# Patient Record
Sex: Male | Born: 1960
Health system: Southern US, Community
[De-identification: ages and names within clinical notes are randomized; demographics above are authoritative.]

## PROBLEM LIST (undated history)

## (undated) DIAGNOSIS — K2270 Barrett's esophagus without dysplasia: Secondary | ICD-10-CM

## (undated) DIAGNOSIS — K259 Gastric ulcer, unspecified as acute or chronic, without hemorrhage or perforation: Secondary | ICD-10-CM

## (undated) DIAGNOSIS — D860 Sarcoidosis of lung: Secondary | ICD-10-CM

## (undated) DIAGNOSIS — K219 Gastro-esophageal reflux disease without esophagitis: Secondary | ICD-10-CM

## (undated) HISTORY — DX: Gastro-esophageal reflux disease without esophagitis: K21.9

## (undated) HISTORY — PX: VASECTOMY: SHX75

## (undated) HISTORY — DX: Sarcoidosis of lung: D86.0

## (undated) HISTORY — DX: Gastric ulcer, unspecified as acute or chronic, without hemorrhage or perforation: K25.9

## (undated) HISTORY — DX: Barrett's esophagus without dysplasia: K22.70

## (undated) HISTORY — PX: VASECTOMY REVERSAL: SHX243

## (undated) HISTORY — PX: LAPAROSCOPIC NISSEN FUNDOPLICATION: SHX1932

---

## 2014-04-07 ENCOUNTER — Ambulatory Visit (INDEPENDENT_AMBULATORY_CARE_PROVIDER_SITE_OTHER): Payer: BC Managed Care – PPO | Admitting: Family Medicine

## 2014-04-07 ENCOUNTER — Other Ambulatory Visit: Payer: Self-pay | Admitting: Physician Assistant

## 2014-04-07 ENCOUNTER — Ambulatory Visit (HOSPITAL_COMMUNITY)
Admission: RE | Admit: 2014-04-07 | Discharge: 2014-04-07 | Disposition: A | Discharge status: Home / Self Care | Payer: BC Managed Care – PPO | Source: Ambulatory Visit | Attending: Physician Assistant | Admitting: Physician Assistant

## 2014-04-07 ENCOUNTER — Telehealth: Payer: Self-pay | Admitting: Physician Assistant

## 2014-04-07 ENCOUNTER — Ambulatory Visit (INDEPENDENT_AMBULATORY_CARE_PROVIDER_SITE_OTHER): Payer: BC Managed Care – PPO

## 2014-04-07 VITALS — BP 193/100 | HR 70 | Temp 97.3°F | Resp 16 | Ht 66.0 in | Wt 183.2 lb

## 2014-04-07 DIAGNOSIS — D869 Sarcoidosis, unspecified: Secondary | ICD-10-CM | POA: Insufficient documentation

## 2014-04-07 DIAGNOSIS — R590 Localized enlarged lymph nodes: Secondary | ICD-10-CM | POA: Insufficient documentation

## 2014-04-07 DIAGNOSIS — R634 Abnormal weight loss: Secondary | ICD-10-CM

## 2014-04-07 DIAGNOSIS — Z1211 Encounter for screening for malignant neoplasm of colon: Secondary | ICD-10-CM

## 2014-04-07 DIAGNOSIS — R072 Precordial pain: Secondary | ICD-10-CM

## 2014-04-07 DIAGNOSIS — K2270 Barrett's esophagus without dysplasia: Secondary | ICD-10-CM | POA: Insufficient documentation

## 2014-04-07 DIAGNOSIS — I10 Essential (primary) hypertension: Secondary | ICD-10-CM | POA: Insufficient documentation

## 2014-04-07 LAB — POCT CBC
Granulocyte percent: 81.4 %G — AB (ref 37–80)
HCT, POC: 48.6 % (ref 43.5–53.7)
HEMOGLOBIN: 16.2 g/dL (ref 14.1–18.1)
LYMPH, POC: 1.1 (ref 0.6–3.4)
MCH, POC: 30.4 pg (ref 27–31.2)
MCHC: 33.4 g/dL (ref 31.8–35.4)
MCV: 91.1 fL (ref 80–97)
MID (cbc): 0.2 (ref 0–0.9)
MPV: 8.2 fL (ref 0–99.8)
POC Granulocyte: 5.6 (ref 2–6.9)
POC LYMPH PERCENT: 15.8 %L (ref 10–50)
POC MID %: 2.8 % (ref 0–12)
Platelet Count, POC: 249 10*3/uL (ref 142–424)
RBC: 5.34 M/uL (ref 4.69–6.13)
RDW, POC: 13.2 %
WBC: 6.9 10*3/uL (ref 4.6–10.2)

## 2014-04-07 MED ORDER — DOXYCYCLINE HYCLATE 100 MG PO CAPS: 100.0000 mg | ORAL_CAPSULE | Freq: Two times a day (BID) | ORAL | Status: DC

## 2014-04-07 MED ORDER — LISINOPRIL 20 MG PO TABS: 20.0000 mg | ORAL_TABLET | Freq: Every day | ORAL | Status: DC

## 2014-04-07 NOTE — Patient Instructions (Addendum)
Go to [facility] register at radiology for U/S today at 2:30 pm   [facility] sent in a referral for you to have an ultrasound of your left armpit to determine what exactly those are. They will be in contact with you to schedule and we will be in contact with you with the results. We've sent in doxycycline 100mg  twice daily for 10 days. This is in case those are abscesses. If they are not we will send you for a lymph node biopsy. Refilled your lisinopril today. Because of your chest pain and a right bundle branch block on your ekg we've referred you to cardiology. They will be in contact with you to schedule.  Please go to ER if chest pain comes on and doesn't go away or if it worsens.  We will be in contact with you regarding the rest of the labs we've ordered.

## 2014-04-07 NOTE — Progress Notes (Signed)
Subjective:    Patient ID: Thomas Jordan, male    DOB: 1961-02-17, 53 y.o.   MRN: [id number]  No PCP Per Patient  Chief Complaint  Patient presents with  . Lymphadenopathy    L axilla x2 days  . Weight Loss    20 lbs over 2 months   There are no active problems to display for this patient.  Prior to Admission medications   Medication Sig Start Date End Date Taking? Authorizing Provider  esomeprazole (NEXIUM) 40 MG capsule Take 40 mg by mouth daily at 12 noon.   Yes Historical Provider, MD  lisinopril (PRINIVIL,ZESTRIL) 20 MG tablet Take 20 mg by mouth daily.   Yes Historical Provider, MD   Medications, allergies, past medical history, surgical history, family history, social history and problem list reviewed and updated.  HPI  59 yom with PMH Barrett's esophagus, sarcoidosis, and HTN presents with left axillary LAN and 20# weight loss past 2 months.   Weight loss has been unintentional. Eating his normal amnt with normal appetite. Two days ago his left armpit was tender and he felt a few swollen LNs. He denies any fever, chills, night sweats, cough, or SOB. He denies recent travel, animal contacts, undercooked meats, unprotected sex or new partners.   He recently moved here from [place] and needs to establish new providers. 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   Denies any family hx of breast cancer or any other cancers.   He mentions he has been having occasional CP over past few months. Sometimes gets the SSCP with exertion but more often at rest. Lasts for minutes and resolves on its own. He is often active without any CP. Rated 2/10 when it comes on. Denies any radiation, SOB, palps, syncope, presyncope, PND.    HTN- Per pt usually well controlled on 20 mg lisinopril. He moved here 4 months ago from [place] and has been out of his meds since then. BP very high today.    Review of  Systems See HPI.     Objective:   Physical Exam  Constitutional: He is oriented to person, place, and time. He appears well-developed and well-nourished.  Non-toxic appearance. He does not have a sickly appearance. He does not appear ill. No distress.  BP 193/100  Pulse 70  Temp(Src) 97.3 F (36.3 C) (Oral)  Resp 16  Ht 5\' 6"  (1.676 m)  Wt 183 lb 3.2 oz (83.099 kg)  BMI 29.58 kg/m2  SpO2 98%   HENT:  Head: Normocephalic and atraumatic.  Right Ear: Hearing, tympanic membrane, external ear and ear canal normal.  Left Ear: Hearing, tympanic membrane, external ear and ear canal normal.  Nose: Nose normal. No mucosal edema or rhinorrhea.  Mouth/Throat: Oropharynx is clear and moist. No oropharyngeal exudate, posterior oropharyngeal edema or posterior oropharyngeal erythema.  Neck: No mass and no thyromegaly present.  Cardiovascular: Normal rate, regular rhythm and normal heart sounds.  Exam reveals no gallop.   No murmur heard. Pulmonary/Chest: Effort normal and breath sounds normal. Not tachypneic. No respiratory distress. He has no decreased breath sounds. He has no wheezes. He has no rhonchi. He has no rales.  Abdominal: Normal appearance and bowel sounds are normal.  Musculoskeletal:       Left shoulder: Normal. He exhibits normal range of motion, no tenderness, no bony tenderness and no swelling.  Lymphadenopathy:  Head (right side): No submental, no submandibular, no tonsillar, no preauricular, no posterior auricular and no occipital adenopathy present.       Head (left side): No submental, no submandibular, no tonsillar, no preauricular, no posterior auricular and no occipital adenopathy present.    He has no cervical adenopathy.    He has axillary adenopathy.       Right axillary: No pectoral and no lateral adenopathy present.       Left axillary: Pectoral and lateral adenopathy present.       Right: No inguinal and no supraclavicular adenopathy present.       Left: No  inguinal and no supraclavicular adenopathy present.  3 swollen LNs/abscesses? centered in left armpit. Each approx 1cm in size. Firm, rubbery. Non-mobile. Pustule centered over 2 of the 3 spots. Otherwise general LAN surrounding left axillary area.   Neurological: He is alert and oriented to person, place, and time.  Skin: No rash noted.  Psychiatric: He has a normal mood and affect. His speech is normal.   Procedure: 1cc 2% lido without epi injected left axilla. Area cleaned and prepped. Attempted pus expression with #11 blade 2 separate swollen areas. No purulence. Area dressed.   Results for orders placed in visit on 04/07/14  POCT CBC      Result Value Ref Range   WBC 6.9  4.6 - 10.2 K/uL   Lymph, poc 1.1  0.6 - 3.4   POC LYMPH PERCENT 15.8  10 - 50 %L   MID (cbc) 0.2  0 - 0.9   POC MID % 2.8  0 - 12 %M   POC Granulocyte 5.6  2 - 6.9   Granulocyte percent 81.4 (*) 37 - 80 %G   RBC 5.34  4.69 - 6.13 M/uL   Hemoglobin 16.2  14.1 - 18.1 g/dL   HCT, POC 48.6  43.5 - 53.7 %   MCV 91.1  80 - 97 fL   MCH, POC 30.4  27 - 31.2 pg   MCHC 33.4  31.8 - 35.4 g/dL   RDW, POC 13.2     Platelet Count, POC 249  142 - 424 K/uL   MPV 8.2  0 - 99.8 fL   [facility] reading (PRIMARY) by  Dr. Lorelei Pont. Findings: No acute abnormality. No hilar LAN.       Assessment & Plan:   70 yom with PMH Barrett's esophagus, sarcoidosis, and HTN presents with left axillary LAN and 20# weight loss past 2 months.    Axillary lymphadenopathy - Plan: DG Chest 2 View, POCT CBC, Comprehensive metabolic panel, CANCELED: CBC with Differential --LAN vs several axillary abscesses --Unable to express pus with #11 blade --Doxy in case abscesses (hx mrsa) --Referral for axillary [place] with f/u pending results  Loss of weight - Plan: DG Chest 2 View, TSH --CXR wnl --CBC wnl --tsh --Await [place] of possible axillary LAN  History Sarcoidosis - Plan: DG Chest 2 View --cxr wnl today  Essential hypertension - Plan: lisinopril  (PRINIVIL,ZESTRIL) 20 MG tablet --BP very elevated today as pt has not been on his lisinopril past 4 months since moving here --Lisinopril refilled  Precordial pain - Plan: EKG 12-Lead --Both exertional and non-exertional --ekg rbbb today. Unsure if new or old --referral to cards - ischemic workup?   Julieta Gutting, PA-C Physician Assistant-Certified [facility]  04/07/2014 12:07 PM

## 2014-04-08 LAB — COMPREHENSIVE METABOLIC PANEL
ALT: 12 U/L (ref 0–53)
AST: 14 U/L (ref 0–37)
Albumin: 4.6 g/dL (ref 3.5–5.2)
Alkaline Phosphatase: 79 U/L (ref 39–117)
BILIRUBIN TOTAL: 0.7 mg/dL (ref 0.2–1.2)
BUN: 12 mg/dL (ref 6–23)
CO2: 29 meq/L (ref 19–32)
Calcium: 9.4 mg/dL (ref 8.4–10.5)
Chloride: 99 mEq/L (ref 96–112)
Creat: 1.03 mg/dL (ref 0.50–1.35)
Glucose, Bld: 83 mg/dL (ref 70–99)
Potassium: 4.8 mEq/L (ref 3.5–5.3)
SODIUM: 139 meq/L (ref 135–145)
TOTAL PROTEIN: 7.2 g/dL (ref 6.0–8.3)

## 2014-04-08 LAB — TSH: TSH: 1.337 u[IU]/mL (ref 0.350–4.500)

## 2014-04-12 NOTE — Telephone Encounter (Signed)
Patient returning call to provider Araceli Bouche  to let him know the lumps have went down and he is feeling better. Patient also wanted to know about a referral he was suppose to have sent to a heart doctor. Patients call back number is [phone]

## 2014-05-10 ENCOUNTER — Ambulatory Visit: Payer: BC Managed Care – PPO | Admitting: Cardiovascular Disease

## 2014-05-15 ENCOUNTER — Encounter: Payer: Self-pay | Admitting: Physician Assistant

## 2016-05-17 IMAGING — US US CHEST/MEDIASTINUM
1 series · 14 of 16 positions shown · non-contrast
Comparison: Chest radiograph 04/07/2014

CLINICAL DATA: Swollen area in the left armpit.

EXAM:
CHEST ULTRASOUND

[Series 1: us chest/mediastinum · 0.05mm/px · 14 of 22 slices shown]
[im 1/22]
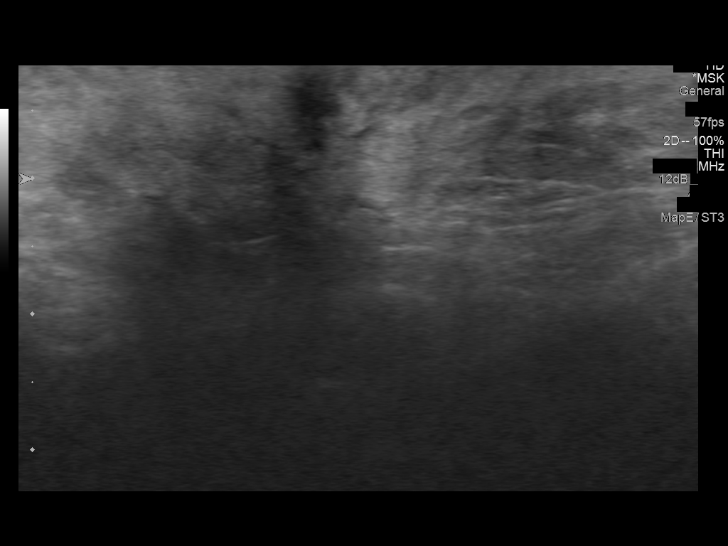
[im 2/22]
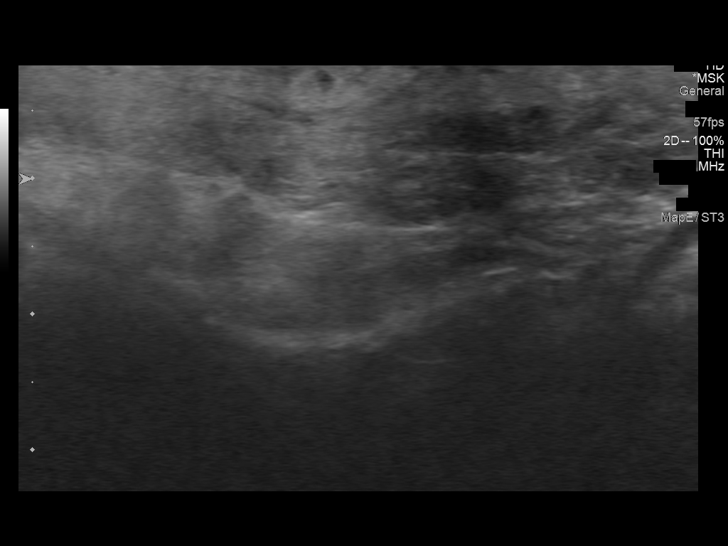
[im 3/22]
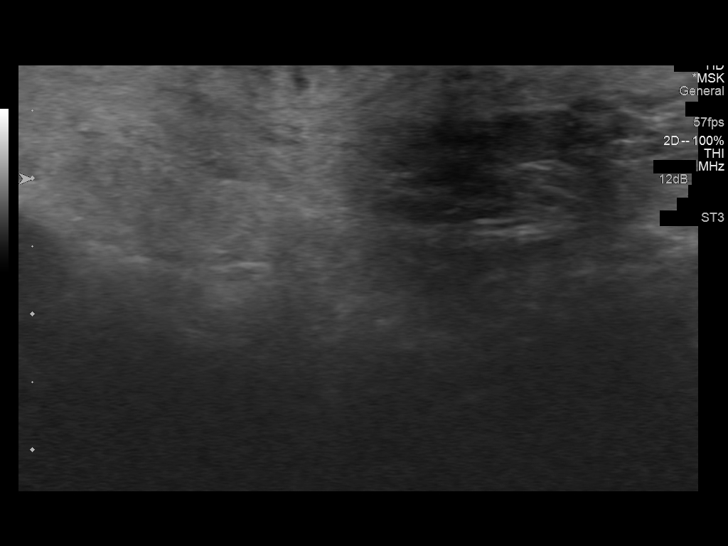
[im 6/22]
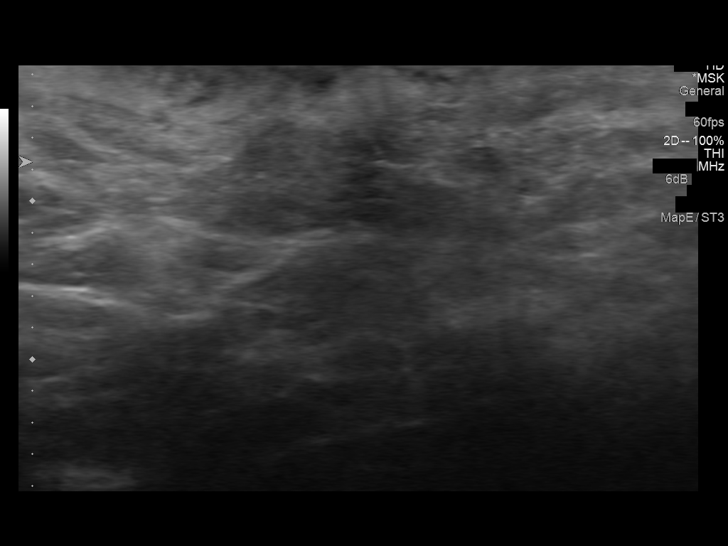
[im 8/22]
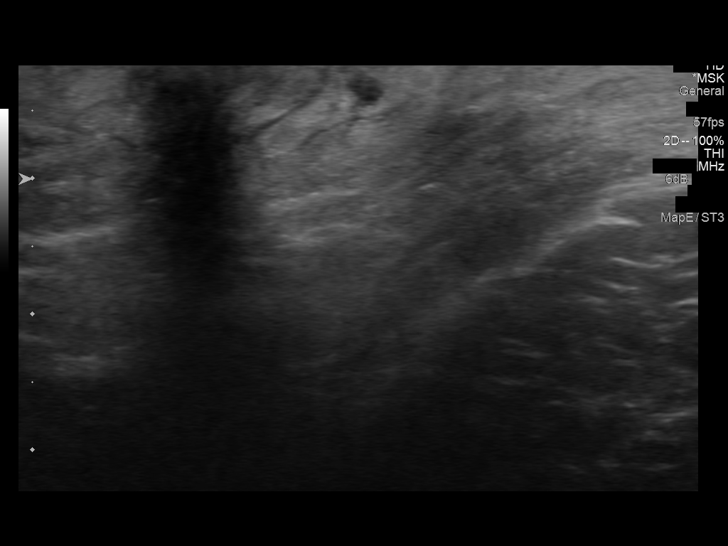
[im 9/22]
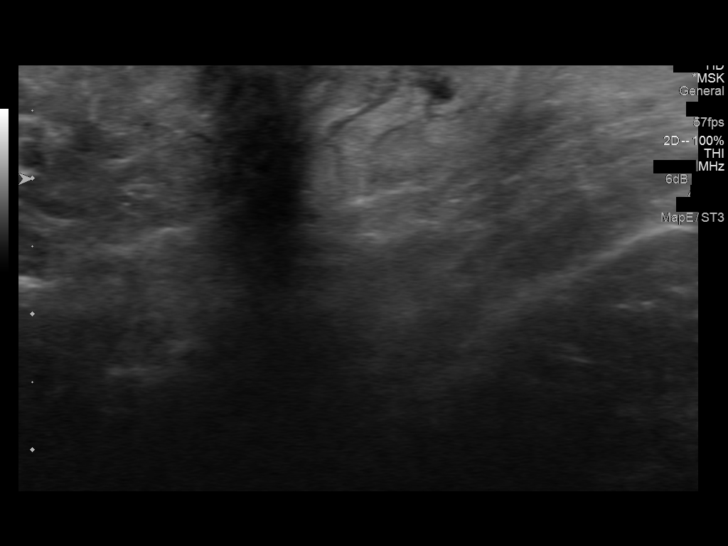
[im 10/22]
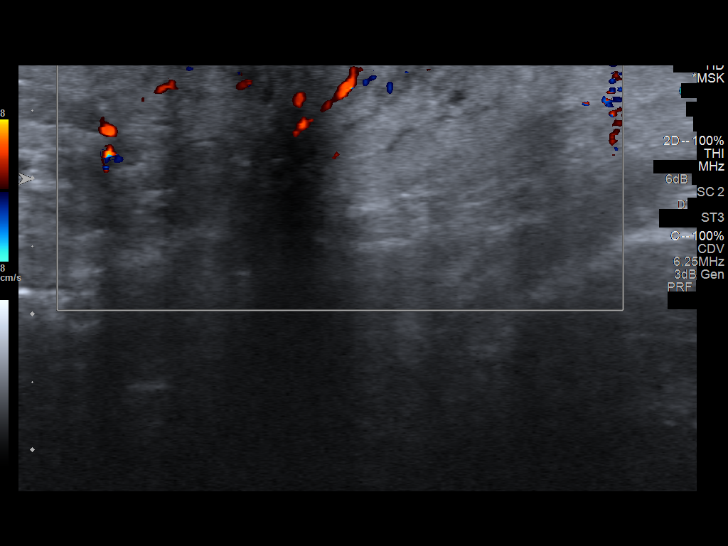
[im 12/22]
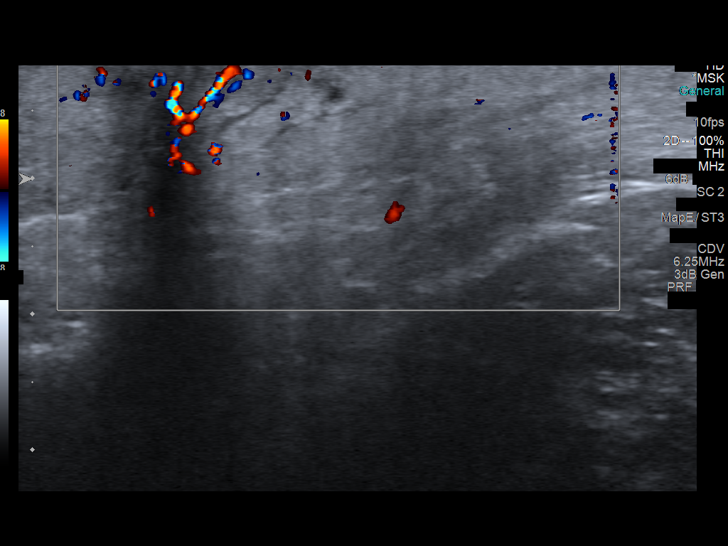
[im 13/22]
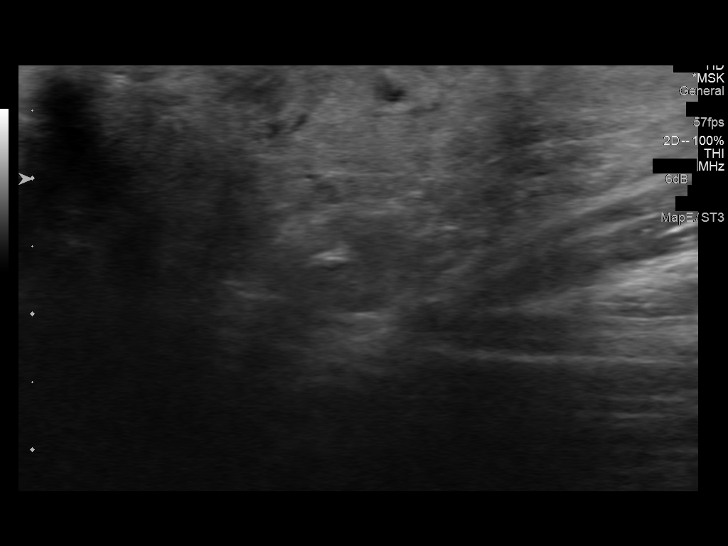
[im 15/22]
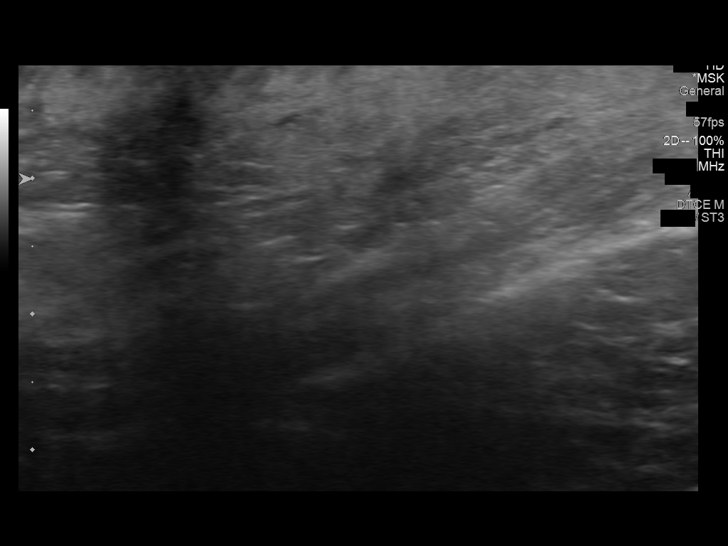
[im 17/22]
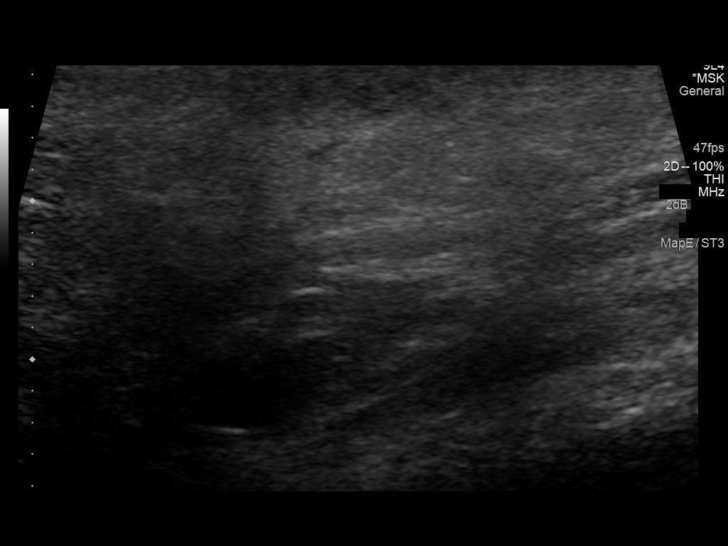
[im 19/22]
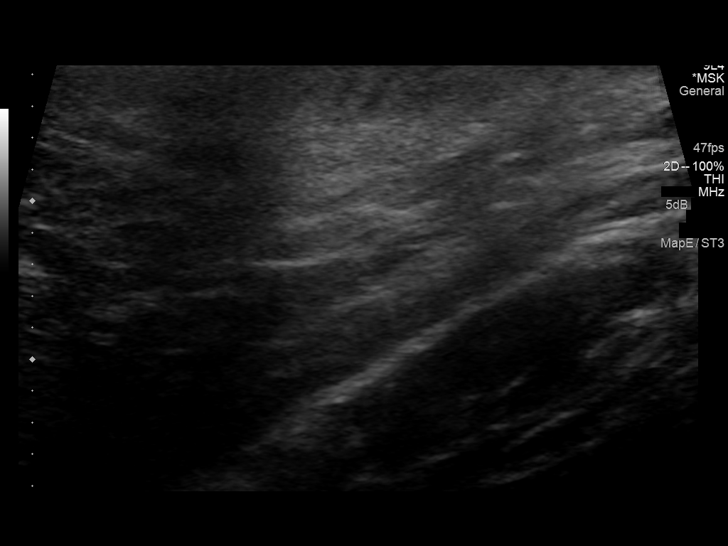
[im 20/22]
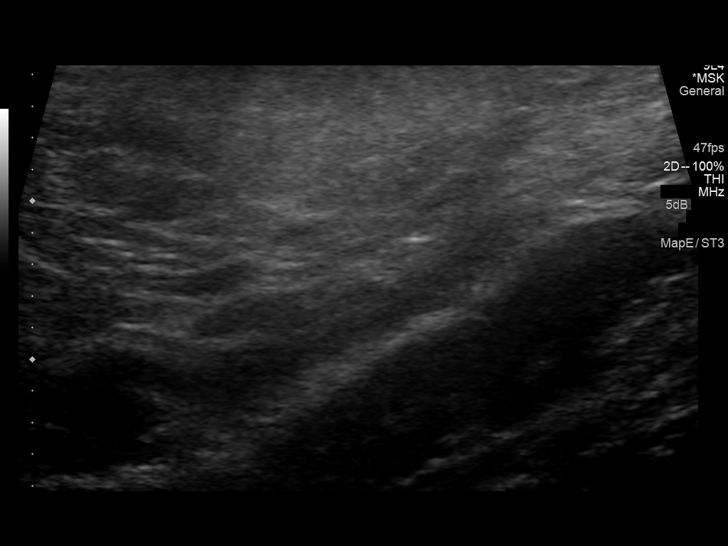
[im 22/22]
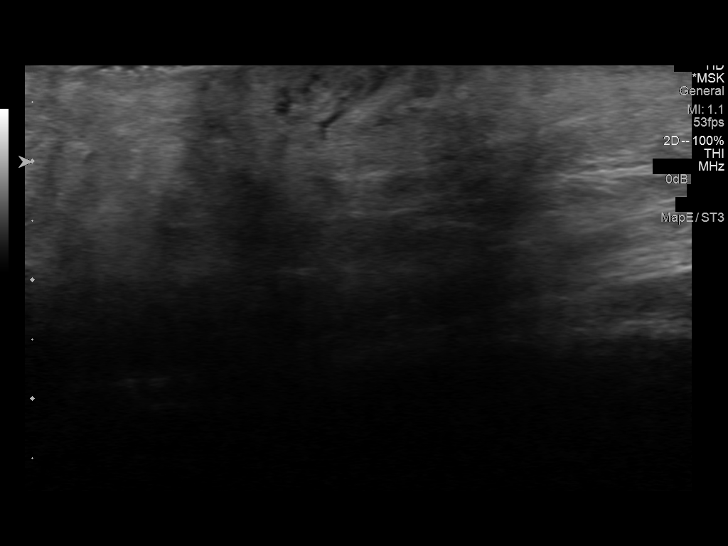

[14 of 16 positions shown; findings below may reference images not displayed]

FINDINGS: Ultrasound images were obtained in the left axilla. There are no
focal solid or cystic lesions in this area. There is mild
subcutaneous edema in the left axilla.
IMPRESSION: Mild subcutaneous edema without focal cystic or solid lesions.

## 2016-05-17 IMAGING — CR DG CHEST 2V
2 series · 2 of 2 positions shown · non-contrast
Comparison: None.

CLINICAL DATA: Weight loss.  History of sarcoidosis

EXAM:
CHEST  2 VIEW

[PA]
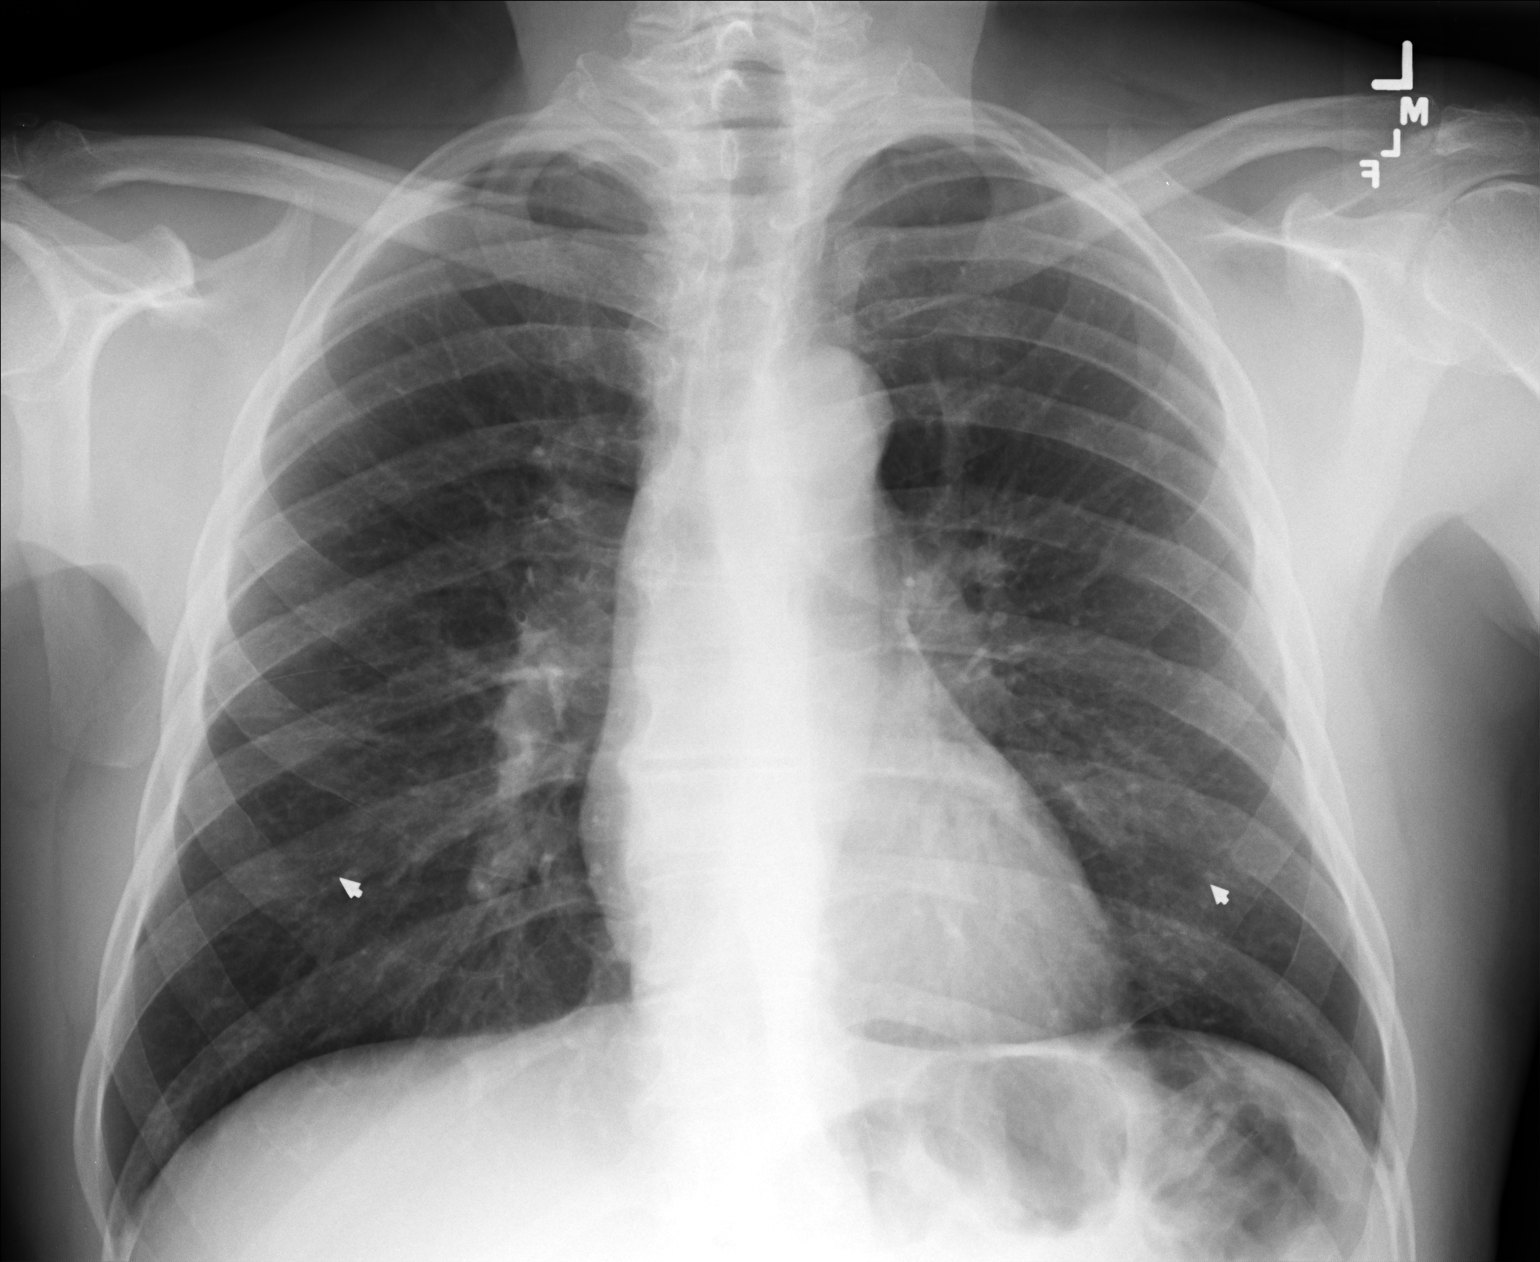

[lateral]
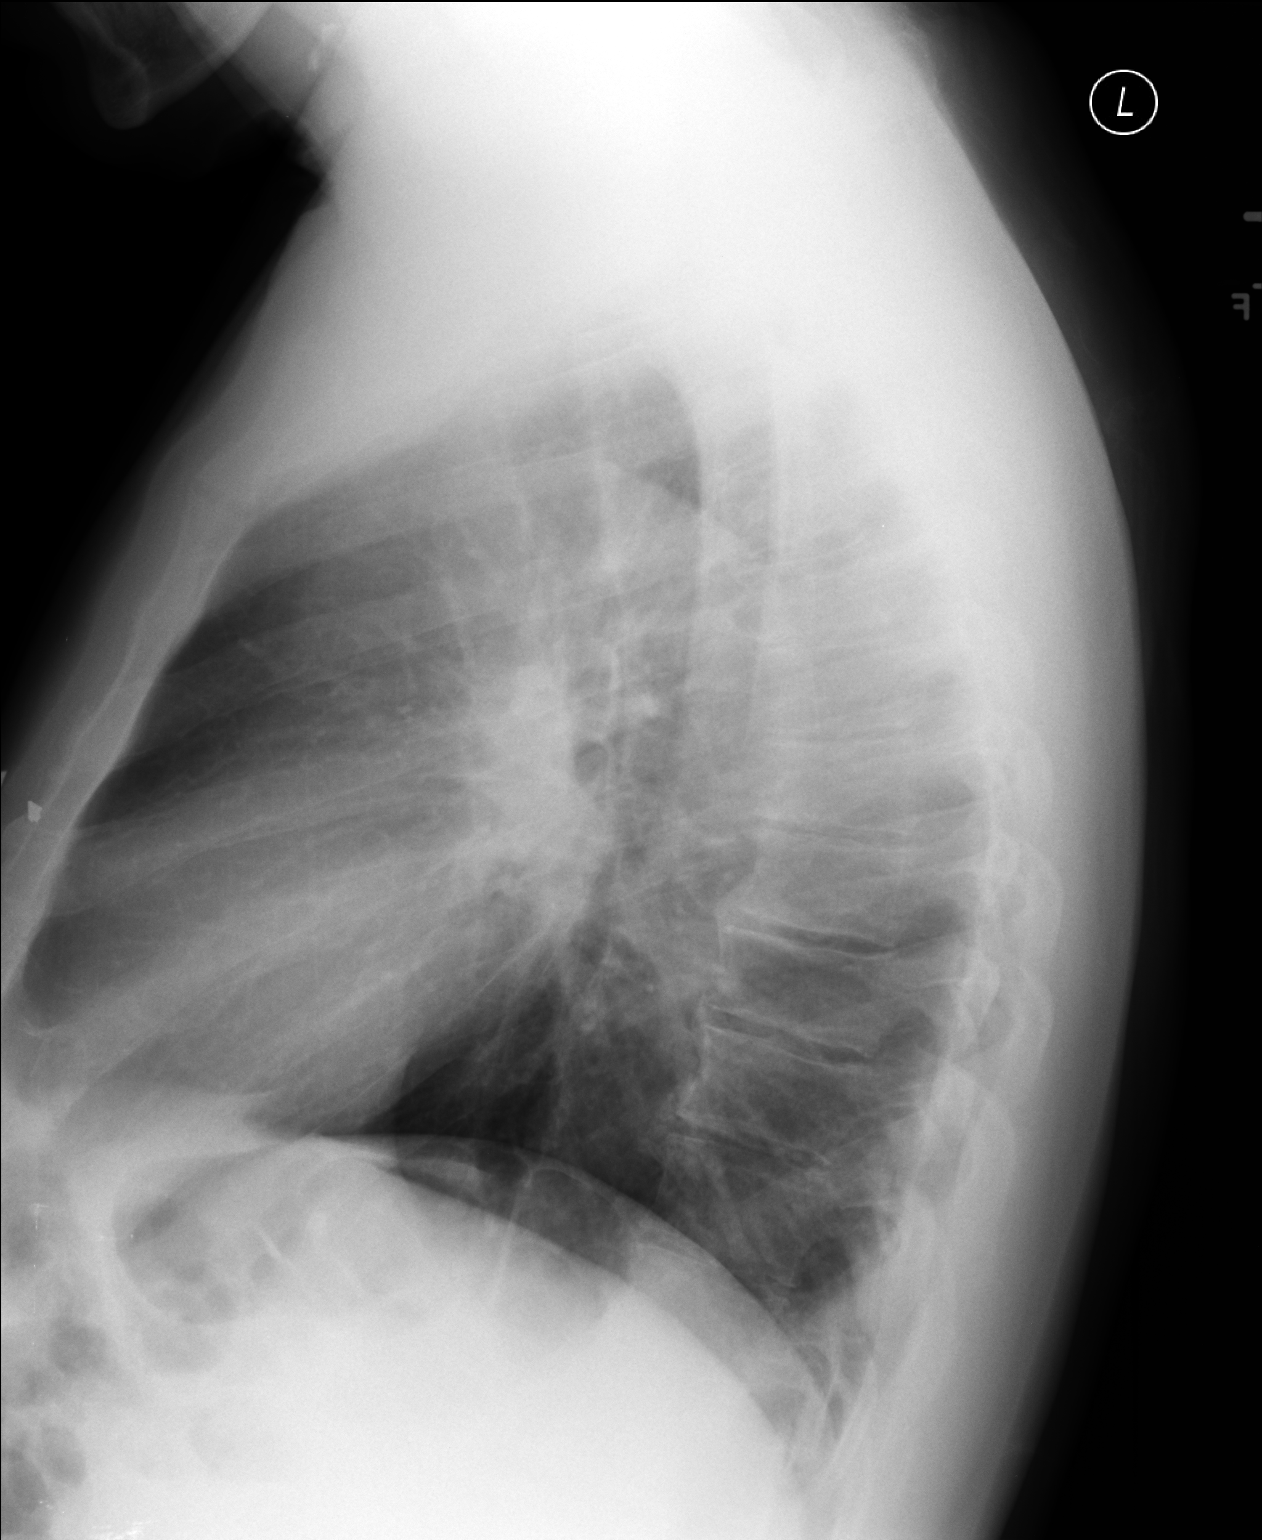

[2 of 2 positions shown; findings below may reference images not displayed]

FINDINGS: Note that nipple markers are present. There is no edema or
consolidation. The interstitium does not appear appreciably
thickened. Heart size and pulmonary vascularity are normal. No
demonstrable adenopathy. There is degenerative change in the
thoracic spine.
IMPRESSION: No edema or consolidation.  No appreciable adenopathy.

## 2019-04-26 DIAGNOSIS — Z23 Encounter for immunization: Secondary | ICD-10-CM | POA: Diagnosis not present

## 2019-08-12 ENCOUNTER — Ambulatory Visit: Payer: Self-pay

## 2020-04-04 DIAGNOSIS — Z23 Encounter for immunization: Secondary | ICD-10-CM | POA: Diagnosis not present

## 2021-01-22 ENCOUNTER — Other Ambulatory Visit: Payer: Self-pay

## 2021-01-22 ENCOUNTER — Ambulatory Visit
Admission: EM | Admit: 2021-01-22 | Discharge: 2021-01-22 | Disposition: A | Discharge status: Home / Self Care | Payer: BC Managed Care – PPO | Attending: Family Medicine | Admitting: Family Medicine

## 2021-01-22 DIAGNOSIS — I10 Essential (primary) hypertension: Secondary | ICD-10-CM | POA: Diagnosis not present

## 2021-01-22 MED ORDER — LISINOPRIL 20 MG PO TABS: 20.0000 mg | ORAL_TABLET | Freq: Every day | ORAL | 0 refills | Status: DC

## 2021-01-22 NOTE — ED Provider Notes (Signed)
[facility]    CSN: [id number] Arrival date & time: 01/22/21  1113      History   Chief Complaint Chief Complaint  Patient presents with   Hypertension    HPI Thomas Jordan is a 60 y.o. male.   HPI Patient presents today for evaluation of hypertension.  Patient underwent a health at work physical today and was subsequently found to have blood pressure readings greater than 200/100.  He was asymptomatic this was a routine health screening.  He reports that he is not having any chest pain, dizziness, headache or shortness of breath.  He was diagnosed with hypertension 7 years ago.  He was started on lisinopril however change jobs and lost health insurance and therefore did not continue with medication.  He was unaware of his blood pressure readings until his health at work physical today.  He would like to resume blood pressure medication and get established with a primary care doctor. Past Medical History:  Diagnosis Date   Barrett esophagus    GERD (gastroesophageal reflux disease)    Improved since Nissen 2007   Sarcoidosis of lung ([place])    Diagnosed on CT but has never had issues with it   Ulcer, stomach peptic    Numerous esophageal ulcers prior to Nissen    Patient Active Problem List   Diagnosis Date Noted   Sarcoidosis 04/07/2014   Barrett's esophagus 04/07/2014   Essential hypertension 04/07/2014    Past Surgical History:  Procedure Laterality Date   LAPAROSCOPIC NISSEN FUNDOPLICATION     VASECTOMY     VASECTOMY REVERSAL         Home Medications    Prior to Admission medications   Medication Sig Start Date End Date Taking? Authorizing Provider  doxycycline (VIBRAMYCIN) 100 MG capsule Take 1 capsule (100 mg total) by mouth 2 (two) times daily. 04/07/14   McVeigh, Todd, PA  esomeprazole (NEXIUM) 40 MG capsule Take 40 mg by mouth daily at 12 noon.    [provider]  lisinopril (ZESTRIL) 20 MG tablet Take 1 tablet (20 mg total) by mouth  daily. Take 1 tablet (20 mg total) by mouth daily. 01/22/21   Scot Jun, FNP    Family History Family History  Problem Relation Age of Onset   Heart disease Mother    Hypertension Mother    Heart disease Father     Social History Social History   Tobacco Use   Smoking status: Never   Smokeless tobacco: Current    Types: Snuff  Substance Use Topics   Alcohol use: Yes    Alcohol/week: 3.0 standard drinks    Types: 3 drink(s) per week   Drug use: No     Allergies   Penicillins   Review of Systems Review of Systems Pertinent negatives listed in HPI  Physical Exam Triage Vital Signs ED Triage Vitals  Enc Vitals Group     BP 01/22/21 1134 (!) 194/93     Pulse Rate 01/22/21 1134 73     Resp 01/22/21 1134 18     Temp 01/22/21 1134 98.5 F (36.9 C)     Temp src --      SpO2 01/22/21 1134 97 %     Weight --      Height --      Head Circumference --      Peak Flow --      Pain Score 01/22/21 1137 0     Pain Loc --  Pain Edu? --      Excl. in [place]? --    No data found.  Updated Vital Signs BP (!) 194/93   Pulse 73   Temp 98.5 F (36.9 C)   Resp 18   SpO2 97%   Visual Acuity Right Eye Distance:   Left Eye Distance:   Bilateral Distance:    Right Eye Near:   Left Eye Near:    Bilateral Near:     Physical Exam Constitutional:      Appearance: Normal appearance.  HENT:     Head: Normocephalic and atraumatic.  Eyes:     General: Lids are normal.     Extraocular Movements: Extraocular movements intact.  Cardiovascular:     Rate and Rhythm: Normal rate and regular rhythm.     Pulses: Normal pulses.     Heart sounds: Murmur heard.  Pulmonary:     Effort: Pulmonary effort is normal.     Breath sounds: Normal breath sounds and air entry. No decreased breath sounds or wheezing.  Musculoskeletal:     Right lower leg: No edema.     Left lower leg: No edema.  Neurological:     Mental Status: He is alert.     UC Treatments / Results   Labs (all labs ordered are listed, but only abnormal results are displayed) Labs Reviewed  BASIC METABOLIC PANEL    EKG   Radiology No results found.  Procedures Procedures (including critical care time)  Medications Ordered in UC Medications - No data to display  Initial Impression / Assessment and Plan / UC Course  I have reviewed the triage vital signs and the nursing notes.  Pertinent labs & imaging results that were available during my care of the patient were reviewed by me and considered in my medical decision making (see chart for details).     Accelerated hypertension with a previous diagnosis of hypertension BMP pending to evaluate electrolyte and renal status ER precaution if any red flag symptoms develop Patient is established with a primary care appointment for September 20 Advised to monitor blood pressure readings at home Final Clinical Impressions(s) / UC Diagnoses   Final diagnoses:  Accelerated hypertension  Essential hypertension     Discharge Instructions      Our office will notify you if your lab warrant any medication changes or any immediate follow-up.   Keep your follow-up with primary care and if unable to keep appointment, call and reschedule.  If able, monitor your blood pressure at home. Goal reading less than 140/90, however since you are restarting blood pressure medication it may take up to 4 weeks for you to see significant reduction in your blood pressure reading.  If at any point you develop any chest pain or chest pressure or severe shortness of breath go immediately to the nearest emergency department for evaluation.     ED Prescriptions     Medication Sig Dispense Auth. Provider   lisinopril (ZESTRIL) 20 MG tablet Take 1 tablet (20 mg total) by mouth daily. Take 1 tablet (20 mg total) by mouth daily. 90 tablet Scot Jun, FNP      PDMP not reviewed this encounter.   Scot Jun, FNP 01/22/21 1310

## 2021-01-22 NOTE — Discharge Instructions (Addendum)
Our office will notify you if your lab warrant any medication changes or any immediate follow-up.   Keep your follow-up with primary care and if unable to keep appointment, call and reschedule.  If able, monitor your blood pressure at home. Goal reading less than 140/90, however since you are restarting blood pressure medication it may take up to 4 weeks for you to see significant reduction in your blood pressure reading.  If at any point you develop any chest pain or chest pressure or severe shortness of breath go immediately to the nearest emergency department for evaluation.

## 2021-01-22 NOTE — ED Triage Notes (Signed)
Pt presents with report of high blood pressure discovered at wellness check was 220/110. Pt states used to take lisnipril but lost weight and quit taking. Pt denies headache or blurred vision

## 2021-01-23 LAB — BASIC METABOLIC PANEL
BUN/Creatinine Ratio: 12 (ref 10–24)
BUN: 11 mg/dL (ref 8–27)
CO2: 21 mmol/L (ref 20–29)
Calcium: 9.8 mg/dL (ref 8.6–10.2)
Chloride: 102 mmol/L (ref 96–106)
Creatinine, Ser: 0.95 mg/dL (ref 0.76–1.27)
Glucose: 79 mg/dL (ref 65–99)
Potassium: 4.2 mmol/L (ref 3.5–5.2)
Sodium: 139 mmol/L (ref 134–144)
eGFR: 91 mL/min/{1.73_m2} (ref >59)

## 2021-02-27 ENCOUNTER — Other Ambulatory Visit: Payer: Self-pay

## 2021-02-27 ENCOUNTER — Encounter: Payer: Self-pay | Admitting: Internal Medicine

## 2021-02-27 ENCOUNTER — Encounter (INDEPENDENT_AMBULATORY_CARE_PROVIDER_SITE_OTHER): Payer: Self-pay | Admitting: *Deleted

## 2021-02-27 ENCOUNTER — Ambulatory Visit: Payer: BC Managed Care – PPO | Admitting: Internal Medicine

## 2021-02-27 VITALS — BP 140/84 | HR 74 | Temp 98.2°F | Resp 18 | Ht 67.0 in | Wt 173.1 lb

## 2021-02-27 DIAGNOSIS — Z7689 Persons encountering health services in other specified circumstances: Secondary | ICD-10-CM

## 2021-02-27 DIAGNOSIS — Z0001 Encounter for general adult medical examination with abnormal findings: Secondary | ICD-10-CM

## 2021-02-27 DIAGNOSIS — Z1159 Encounter for screening for other viral diseases: Secondary | ICD-10-CM

## 2021-02-27 DIAGNOSIS — K22719 Barrett's esophagus with dysplasia, unspecified: Secondary | ICD-10-CM

## 2021-02-27 DIAGNOSIS — D869 Sarcoidosis, unspecified: Secondary | ICD-10-CM

## 2021-02-27 DIAGNOSIS — Z0000 Encounter for general adult medical examination without abnormal findings: Secondary | ICD-10-CM

## 2021-02-27 DIAGNOSIS — Z114 Encounter for screening for human immunodeficiency virus [HIV]: Secondary | ICD-10-CM

## 2021-02-27 DIAGNOSIS — I10 Essential (primary) hypertension: Secondary | ICD-10-CM | POA: Diagnosis not present

## 2021-02-27 DIAGNOSIS — D229 Melanocytic nevi, unspecified: Secondary | ICD-10-CM

## 2021-02-27 MED ORDER — ESOMEPRAZOLE MAGNESIUM 40 MG PO CPDR: 40.0000 mg | DELAYED_RELEASE_CAPSULE | Freq: Every day | ORAL | 1 refills | Status: DC

## 2021-02-27 MED ORDER — LISINOPRIL 20 MG PO TABS: 20.0000 mg | ORAL_TABLET | Freq: Every day | ORAL | 3 refills | Status: DC

## 2021-02-27 NOTE — Progress Notes (Signed)
New Patient Office Visit  Subjective:  Patient ID: Thomas Jordan, male    DOB: December 07, 1960  Age: 60 y.o. MRN: [id number]  CC:  Chief Complaint  Patient presents with   New Patient (Initial Visit)    Establish care bp issues     HPI Perkins Huot is a 60 year old male with PMH of HTN, Barett's esophagus and sarcoidosis who presents for establishing care.  His wife is present during the visit.  HTN: His BP was slightly elevated that target range today. He recently started taking Lisinopril after his BP was found to be ~210/110 at his workplace. He has intermittent, dull headache, but denies dizziness, chest pain, dyspnea or palpitations.  He has had Nissen procedure for dysphagia due to Barrett's esophagus.  He denies any heartburn, dysphagia or odynophagia currently.  He used to take Nexium in the past.  He has history of sarcoidosis, but denies any dyspnea currently.  He had chronic leg swelling and lymphadenopathy, which led to excision of femoral lymph nodes and biopsy.  He was told of sarcoidosis at that time.  His wife has noticed a mole on his back, which is stable in size and color.  Denies any itching or discharge from the mole.  He has had COVID vaccines.    Past Medical History:  Diagnosis Date   Barrett esophagus    GERD (gastroesophageal reflux disease)    Improved since Nissen 2007   Sarcoidosis of lung ([place])    Diagnosed on CT but has never had issues with it   Ulcer, stomach peptic    Numerous esophageal ulcers prior to Nissen    Past Surgical History:  Procedure Laterality Date   LAPAROSCOPIC NISSEN FUNDOPLICATION     VASECTOMY     VASECTOMY REVERSAL      Family History  Problem Relation Age of Onset   Heart disease Mother    Hypertension Mother    Heart disease Father     Social History   Socioeconomic History   Marital status: Married    Spouse name: Not on file   Number of children: Not on file   Years of education: Not on file   Highest  education level: Not on file  Occupational History   Not on file  Tobacco Use   Smoking status: Never   Smokeless tobacco: Current    Types: Snuff  Substance and Sexual Activity   Alcohol use: Yes    Alcohol/week: 3.0 standard drinks    Types: 3 drink(s) per week   Drug use: No   Sexual activity: Not on file  Other Topics Concern   Not on file  Social History Narrative   Not on file   Social Determinants of Health   Financial Resource Strain: Not on file  Food Insecurity: Not on file  Transportation Needs: Not on file  Physical Activity: Not on file  Stress: Not on file  Social Connections: Not on file  Intimate Partner Violence: Not on file    ROS Review of Systems  Constitutional:  Negative for chills and fever.  HENT:  Negative for congestion and sore throat.   Eyes:  Negative for pain and discharge.  Respiratory:  Negative for cough and shortness of breath.   Cardiovascular:  Negative for chest pain and palpitations.  Gastrointestinal:  Negative for constipation, diarrhea, nausea and vomiting.  Endocrine: Negative for polydipsia and polyuria.  Genitourinary:  Negative for dysuria and hematuria.  Musculoskeletal:  Negative for neck pain and neck stiffness.  Skin:  Negative for rash.  Neurological:  Positive for headaches. Negative for dizziness, weakness and numbness.  Psychiatric/Behavioral:  Negative for agitation and behavioral problems.    Objective:   Today's Vitals: BP 140/84 (BP Location: Left Arm, Cuff Size: Normal)   Pulse 74   Temp 98.2 F (36.8 C) (Oral)   Resp 18   Ht 5\' 7"  (1.702 m)   Wt 173 lb 1.3 oz (78.5 kg)   SpO2 96%   BMI 27.11 kg/m   Physical Exam Vitals reviewed.  Constitutional:      General: He is not in acute distress.    Appearance: He is not diaphoretic.  HENT:     Head: Normocephalic and atraumatic.     Nose: Nose normal.     Mouth/Throat:     Mouth: Mucous membranes are moist.  Eyes:     General: No scleral icterus.     Extraocular Movements: Extraocular movements intact.  Cardiovascular:     Rate and Rhythm: Normal rate and regular rhythm.     Pulses: Normal pulses.     Heart sounds: Normal heart sounds. No murmur heard. Pulmonary:     Breath sounds: Normal breath sounds. No wheezing or rales.  Musculoskeletal:     Cervical back: Neck supple. No tenderness.     Right lower leg: No edema.     Left lower leg: No edema.  Skin:    General: Skin is warm.     Findings: Rash (Brownish papule - about 1 cm in diameter over left side of back (flank area)) present.  Neurological:     General: No focal deficit present.     Mental Status: He is alert and oriented to person, place, and time.  Psychiatric:        Mood and Affect: Mood normal.        Behavior: Behavior normal.    Assessment & Plan:   Problem List Items Addressed This Visit       Cardiovascular and Mediastinum   Essential hypertension    BP Readings from Last 1 Encounters:  02/27/21 140/84  Elevated with Lisinopril 20 mg QD, but he recently started taking it, will continue for now Counseled for compliance with the medications Advised DASH diet and moderate exercise/walking, at least 150 mins/week       Relevant Medications   lisinopril (ZESTRIL) 20 MG tablet     Digestive   Barrett's esophagus    S/p Nissen procedure in 2007 Started Nexium Referred to GI      Relevant Medications   esomeprazole (NEXIUM) 40 MG capsule   Other Relevant Orders   Ambulatory referral to Gastroenterology     Musculoskeletal and Integument   Benign pigmented mole    Over back region about 1 cm in diameter No asymmetry or color variation, no change in size recently, no discharge Monitor for now        Other   Sarcoidosis    Asymptomatic currently      Encounter to establish care - Primary    Care established Previous chart reviewed History and medications reviewed with the patient      Other Visit Diagnoses        Need for  hepatitis C screening test       Relevant Orders   Hepatitis C Antibody   Encounter for screening for HIV       Relevant Orders   HIV antibody (with reflex)       Outpatient Encounter Medications  as of 02/27/2021  Medication Sig   esomeprazole (NEXIUM) 40 MG capsule Take 1 capsule (40 mg total) by mouth daily.   [DISCONTINUED] lisinopril (ZESTRIL) 20 MG tablet Take 1 tablet (20 mg total) by mouth daily. Take 1 tablet (20 mg total) by mouth daily.   lisinopril (ZESTRIL) 20 MG tablet Take 1 tablet (20 mg total) by mouth daily. Take 1 tablet (20 mg total) by mouth daily.   [DISCONTINUED] doxycycline (VIBRAMYCIN) 100 MG capsule Take 1 capsule (100 mg total) by mouth 2 (two) times daily.   [DISCONTINUED] esomeprazole (NEXIUM) 40 MG capsule Take 40 mg by mouth daily at 12 noon.   No facility-administered encounter medications on file as of 02/27/2021.    Follow-up: Return in about 4 months (around 06/29/2021) for Annual physical.   Lindell Spar, MD

## 2021-02-27 NOTE — Assessment & Plan Note (Signed)
Care established Previous chart reviewed History and medications reviewed with the patient 

## 2021-02-27 NOTE — Assessment & Plan Note (Signed)
BP Readings from Last 1 Encounters:  02/27/21 140/84   Elevated with Lisinopril 20 mg QD, but he recently started taking it, will continue for now Counseled for compliance with the medications Advised DASH diet and moderate exercise/walking, at least 150 mins/week

## 2021-02-27 NOTE — Patient Instructions (Signed)
Please continue taking Lisinopril as prescribed.  Please follow DASH diet and perform moderate exercise/walking at least 150 mins/week.  Please start taking Nexium.  Please get fasting blood tests done within a week.

## 2021-02-27 NOTE — Assessment & Plan Note (Signed)
S/p Nissen procedure in 2007 Started Nexium Referred to GI

## 2021-02-27 NOTE — Assessment & Plan Note (Signed)
Over back region about 1 cm in diameter No asymmetry or color variation, no change in size recently, no discharge Monitor for now

## 2021-02-27 NOTE — Assessment & Plan Note (Signed)
Asymptomatic currently 

## 2021-03-01 DIAGNOSIS — Z1159 Encounter for screening for other viral diseases: Secondary | ICD-10-CM | POA: Diagnosis not present

## 2021-03-01 DIAGNOSIS — Z0000 Encounter for general adult medical examination without abnormal findings: Secondary | ICD-10-CM | POA: Diagnosis not present

## 2021-03-01 DIAGNOSIS — Z114 Encounter for screening for human immunodeficiency virus [HIV]: Secondary | ICD-10-CM | POA: Diagnosis not present

## 2021-03-02 LAB — CBC WITH DIFFERENTIAL/PLATELET
Basophils Absolute: 0.1 10*3/uL (ref 0.0–0.2)
Basos: 1 %
EOS (ABSOLUTE): 0.5 10*3/uL — ABNORMAL HIGH (ref 0.0–0.4)
Eos: 9 %
Hematocrit: 42.7 % (ref 37.5–51.0)
Hemoglobin: 14.2 g/dL (ref 13.0–17.7)
Immature Grans (Abs): 0 10*3/uL (ref 0.0–0.1)
Immature Granulocytes: 0 %
Lymphocytes Absolute: 1 10*3/uL (ref 0.7–3.1)
Lymphs: 19 %
MCH: 30.9 pg (ref 26.6–33.0)
MCHC: 33.3 g/dL (ref 31.5–35.7)
MCV: 93 fL (ref 79–97)
Monocytes Absolute: 0.5 10*3/uL (ref 0.1–0.9)
Monocytes: 10 %
Neutrophils Absolute: 3.2 10*3/uL (ref 1.4–7.0)
Neutrophils: 61 %
Platelets: 224 10*3/uL (ref 150–450)
RBC: 4.6 x10E6/uL (ref 4.14–5.80)
RDW: 13.5 % (ref 11.6–15.4)
WBC: 5.3 10*3/uL (ref 3.4–10.8)

## 2021-03-02 LAB — CMP14+EGFR
ALT: 9 IU/L (ref 0–44)
AST: 13 IU/L (ref 0–40)
Albumin/Globulin Ratio: 1.9 (ref 1.2–2.2)
Albumin: 4.2 g/dL (ref 3.8–4.8)
Alkaline Phosphatase: 64 IU/L (ref 44–121)
BUN/Creatinine Ratio: 12 (ref 10–24)
BUN: 14 mg/dL (ref 8–27)
Bilirubin Total: 0.6 mg/dL (ref 0.0–1.2)
CO2: 22 mmol/L (ref 20–29)
Calcium: 9.7 mg/dL (ref 8.6–10.2)
Chloride: 102 mmol/L (ref 96–106)
Creatinine, Ser: 1.21 mg/dL (ref 0.76–1.27)
Globulin, Total: 2.2 g/dL (ref 1.5–4.5)
Glucose: 89 mg/dL (ref 65–99)
Potassium: 5.2 mmol/L (ref 3.5–5.2)
Sodium: 138 mmol/L (ref 134–144)
Total Protein: 6.4 g/dL (ref 6.0–8.5)
eGFR: 68 mL/min/{1.73_m2} (ref >59)

## 2021-03-02 LAB — TSH+FREE T4
Free T4: 1.12 ng/dL (ref 0.82–1.77)
TSH: 1.31 u[IU]/mL (ref 0.450–4.500)

## 2021-03-02 LAB — HIV ANTIBODY (ROUTINE TESTING W REFLEX): HIV Screen 4th Generation wRfx: NONREACTIVE

## 2021-03-02 LAB — LIPID PANEL
Chol/HDL Ratio: 2.8 ratio (ref 0.0–5.0)
Cholesterol, Total: 184 mg/dL (ref 100–199)
HDL: 65 mg/dL (ref >39)
LDL Chol Calc (NIH): 109 mg/dL — ABNORMAL HIGH (ref 0–99)
Triglycerides: 54 mg/dL (ref 0–149)
VLDL Cholesterol Cal: 10 mg/dL (ref 5–40)

## 2021-03-02 LAB — VITAMIN D 25 HYDROXY (VIT D DEFICIENCY, FRACTURES): Vit D, 25-Hydroxy: 57.7 ng/mL (ref 30.0–100.0)

## 2021-03-02 LAB — HEMOGLOBIN A1C
Est. average glucose Bld gHb Est-mCnc: 111 mg/dL
Hgb A1c MFr Bld: 5.5 % (ref 4.8–5.6)

## 2021-03-02 LAB — HEPATITIS C ANTIBODY: Hep C Virus Ab: <0.1 s/co ratio (ref 0.0–0.9)

## 2021-04-26 ENCOUNTER — Other Ambulatory Visit: Payer: Self-pay | Admitting: Family Medicine

## 2021-04-26 DIAGNOSIS — I10 Essential (primary) hypertension: Secondary | ICD-10-CM

## 2021-06-07 ENCOUNTER — Ambulatory Visit: Payer: BC Managed Care – PPO | Admitting: Internal Medicine

## 2021-06-07 ENCOUNTER — Encounter: Payer: Self-pay | Admitting: Internal Medicine

## 2021-06-07 ENCOUNTER — Other Ambulatory Visit: Payer: Self-pay

## 2021-06-07 VITALS — BP 138/76 | HR 77 | Resp 18 | Ht 67.0 in | Wt 180.0 lb

## 2021-06-07 DIAGNOSIS — M109 Gout, unspecified: Secondary | ICD-10-CM

## 2021-06-07 DIAGNOSIS — M24541 Contracture, right hand: Secondary | ICD-10-CM | POA: Diagnosis not present

## 2021-06-07 DIAGNOSIS — I10 Essential (primary) hypertension: Secondary | ICD-10-CM

## 2021-06-07 MED ORDER — COLCHICINE 0.6 MG PO TABS: ORAL_TABLET | ORAL | 0 refills | Status: DC

## 2021-06-07 MED ORDER — LISINOPRIL 20 MG PO TABS: 20.0000 mg | ORAL_TABLET | Freq: Every day | ORAL | 1 refills | Status: DC

## 2021-06-07 NOTE — Patient Instructions (Addendum)
Please take Colchicine as prescribed.  Please avoid alcohol for now.

## 2021-06-07 NOTE — Assessment & Plan Note (Signed)
BP Readings from Last 1 Encounters:  06/07/21 138/76   Well controlled with lisinopril 20 mg QD Counseled for compliance with the medications Advised DASH diet and moderate exercise/walking, at least 150 mins/week

## 2021-06-07 NOTE — Progress Notes (Signed)
Acute Office Visit  Subjective:    Patient ID: Thomas Jordan, male    DOB: [birth year], 60 y.o.   MRN: [id number]  Chief Complaint  Patient presents with   Acute Visit    Right thumb pain started 04/07/21 sharp pain especially in joints and sometimes locks up    HPI Patient is in today for complaint of right thumb swelling and pain for the last 2 months.  He has been using Tylenol and IcyHot with no relief.  He has severe pain and locking sensation upon movement at the DIP joint of thumb.  Denies any recent injury.  Denies any numbness or tingling or weakness of the hand.  Of note, he has remote history of gout.  He does report having alcohol and barbecued meat recently.  Past Medical History:  Diagnosis Date   Barrett esophagus    GERD (gastroesophageal reflux disease)    Improved since Nissen 2007   Sarcoidosis of lung ([place])    Diagnosed on CT but has never had issues with it   Ulcer, stomach peptic    Numerous esophageal ulcers prior to Nissen    Past Surgical History:  Procedure Laterality Date   LAPAROSCOPIC NISSEN FUNDOPLICATION     VASECTOMY     VASECTOMY REVERSAL      Family History  Problem Relation Age of Onset   Heart disease Mother    Hypertension Mother    Heart disease Father     Social History   Socioeconomic History   Marital status: Married    Spouse name: Not on file   Number of children: Not on file   Years of education: Not on file   Highest education level: Not on file  Occupational History   Not on file  Tobacco Use   Smoking status: Never   Smokeless tobacco: Current    Types: Snuff  Substance and Sexual Activity   Alcohol use: Yes    Alcohol/week: 3.0 standard drinks    Types: 3 drink(s) per week   Drug use: No   Sexual activity: Not on file  Other Topics Concern   Not on file  Social History Narrative   Not on file   Social Determinants of Health   Financial Resource Strain: Not on file  Food Insecurity: Not on file   Transportation Needs: Not on file  Physical Activity: Not on file  Stress: Not on file  Social Connections: Not on file  Intimate Partner Violence: Not on file    Outpatient Medications Prior to Visit  Medication Sig Dispense Refill   esomeprazole (NEXIUM) 40 MG capsule Take 1 capsule (40 mg total) by mouth daily. 90 capsule 1   lisinopril (ZESTRIL) 20 MG tablet Take 1 tablet (20 mg total) by mouth daily. Take 1 tablet (20 mg total) by mouth daily. 30 tablet 3   No facility-administered medications prior to visit.    Allergies  Allergen Reactions   Penicillins Rash    Review of Systems  Constitutional:  Negative for chills and fever.  HENT:  Negative for congestion and sore throat.   Eyes:  Negative for pain and discharge.  Respiratory:  Negative for cough and shortness of breath.   Cardiovascular:  Negative for chest pain and palpitations.  Gastrointestinal:  Negative for constipation, diarrhea, nausea and vomiting.  Endocrine: Negative for polydipsia and polyuria.  Genitourinary:  Negative for dysuria and hematuria.  Musculoskeletal:  Negative for neck pain and neck stiffness.  Right thumb swelling and pain  Skin:  Negative for rash.  Neurological:  Negative for dizziness, weakness and numbness.  Psychiatric/Behavioral:  Negative for agitation and behavioral problems.       Objective:    Physical Exam Vitals reviewed.  Constitutional:      General: He is not in acute distress.    Appearance: He is not diaphoretic.  HENT:     Head: Normocephalic and atraumatic.     Nose: Nose normal.     Mouth/Throat:     Mouth: Mucous membranes are moist.  Eyes:     General: No scleral icterus.    Extraocular Movements: Extraocular movements intact.  Cardiovascular:     Rate and Rhythm: Normal rate and regular rhythm.     Pulses: Normal pulses.     Heart sounds: Normal heart sounds. No murmur heard. Pulmonary:     Breath sounds: Normal breath sounds. No wheezing or  rales.  Musculoskeletal:        General: Swelling (PIP and DIP joints of right thumb) present.     Cervical back: Neck supple. No tenderness.     Right lower leg: No edema.     Left lower leg: No edema.  Skin:    General: Skin is warm.     Findings: No erythema.  Neurological:     General: No focal deficit present.     Mental Status: He is alert and oriented to person, place, and time.  Psychiatric:        Mood and Affect: Mood normal.        Behavior: Behavior normal.    BP 138/76 (BP Location: Left Arm, Patient Position: Sitting, Cuff Size: Normal)    Pulse 77    Resp 18    Ht 5\' 7"  (1.702 m)    Wt 180 lb (81.6 kg)    SpO2 99%    BMI 28.19 kg/m  Wt Readings from Last 3 Encounters:  06/07/21 180 lb (81.6 kg)  02/27/21 173 lb 1.3 oz (78.5 kg)  04/07/14 183 lb 3.2 oz (83.1 kg)        Assessment & Plan:   Problem List Items Addressed This Visit       Cardiovascular and Mediastinum   Essential hypertension    BP Readings from Last 1 Encounters:  06/07/21 138/76  Well controlled with lisinopril 20 mg QD Counseled for compliance with the medications Advised DASH diet and moderate exercise/walking, at least 150 mins/week       Relevant Medications   lisinopril (ZESTRIL) 20 MG tablet     Other   Gout - Primary    PIP joint swelling likely due to gout Started colchicine DIP joint stiffening could be due to contracture, referred to orthopedic surgeon      Relevant Medications   colchicine 0.6 MG tablet   Other Relevant Orders   Ambulatory referral to Orthopedic Surgery   Other Visit Diagnoses     Contracture of hand joint, right       Relevant Orders   Ambulatory referral to Orthopedic Surgery        Meds ordered this encounter  Medications   colchicine 0.6 MG tablet    Sig: Take 2 tablets once, then 1 tablet 1 hour later. Start taking 1 tablet once daily from tomorrow.    Dispense:  10 tablet    Refill:  0   lisinopril (ZESTRIL) 20 MG tablet    Sig:  Take 1 tablet (20 mg total) by  mouth daily. Take 1 tablet (20 mg total) by mouth daily.    Dispense:  90 tablet    Refill:  1     Blayre Papania Keith Rake, MD

## 2021-06-07 NOTE — Assessment & Plan Note (Signed)
PIP joint swelling likely due to gout Started colchicine DIP joint stiffening could be due to contracture, referred to orthopedic surgeon

## 2021-06-26 ENCOUNTER — Other Ambulatory Visit: Payer: Self-pay

## 2021-06-26 ENCOUNTER — Encounter: Payer: Self-pay | Admitting: Orthopedic Surgery

## 2021-06-26 ENCOUNTER — Ambulatory Visit: Payer: BC Managed Care – PPO

## 2021-06-26 ENCOUNTER — Ambulatory Visit (INDEPENDENT_AMBULATORY_CARE_PROVIDER_SITE_OTHER): Payer: BC Managed Care – PPO | Admitting: Orthopedic Surgery

## 2021-06-26 ENCOUNTER — Encounter: Payer: BC Managed Care – PPO | Admitting: Internal Medicine

## 2021-06-26 VITALS — BP 124/86 | HR 65 | Ht 67.0 in | Wt 182.0 lb

## 2021-06-26 DIAGNOSIS — M65311 Trigger thumb, right thumb: Secondary | ICD-10-CM | POA: Diagnosis not present

## 2021-06-26 DIAGNOSIS — M79644 Pain in right finger(s): Secondary | ICD-10-CM

## 2021-06-26 NOTE — Patient Instructions (Addendum)
Also have a small amount of DePuytren's contracture developing.    Instructions Following Joint Injections  In clinic today, you received an injection in one of your joints (sometimes more than one).  Occasionally, you can have some pain at the injection site, this is normal.  You can place ice at the injection site, or take over-the-counter medications such as Tylenol (acetaminophen) or Advil (ibuprofen).  Please follow all directions listed on the bottle.  If your joint (knee or shoulder) becomes swollen, red or very painful, please contact the clinic for additional assistance.   Two medications were injected, including lidocaine and a steroid (often referred to as cortisone).  Lidocaine is effective almost immediately but wears off quickly.  However, the steroid can take a few days to improve your symptoms.  In some cases, it can make your pain worse for a couple of days.  Do not be concerned if this happens as it is common.  You can apply ice or take some over-the-counter medications as needed.

## 2021-06-26 NOTE — Progress Notes (Addendum)
New Patient Visit  Assessment: Thomas Jordan is a 61 y.o. male with the following: 1. Pain of right thumb; symptoms consistent with trigger thumb   Plan: Symptoms and physical exam most consistent with trigger finger.  We discussed etiology, and potential treatment options.  Surgery was discussed in detail, including the plan for procedure, and expected recovery.  After discussing these options, the patient would like to proceed with a steroid injection.  This was completed in clinic today.  Depending on the efficacy of the injection, we could consider another injection.  However, if the injection does not provide sustained relief, I would recommend surgery.  All of this was discussed with the patient, and they are amenable to this plan.  Follow-up as needed.  Also has some DePuytren's contracture cords developing in line with the right finger.  Motion is full.  Mild case overall. He will monitor.    Procedure note injection - Right Thumb A1 Pulley  Verbal consent was obtained to inject the Right Thumb A1 pulley Timeout was completed to confirm the site of injection.  The skin was prepped with alcohol and ethyl chloride was sprayed at the injection site.  A 21-gauge needle was used to inject 40 mg of Depo-medrol and 1% lidocaine (1 cc) into the Right Thumb using a direct anterior approach.  There were no complications. Patient tolerated the procedure well. A sterile bandage was applied     Follow-up: No follow-ups on file.  Subjective:  Chief Complaint  Patient presents with   Hand Pain    Rt thumb triggering for 2-3 months.     History of Present Illness: Thomas Jordan is a 61 y.o. male who has been referred to clinic today by Ihor Dow, MD for evaluation of Right Thumb pain.  He states he has noticed pain in the right thumb for the past 2-3 months.  No specific injury.  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  He notes a locking sensation in the right thumb.  This gets worse at night.  It is very stiff in the morning.  He has tenderness over the base of the thumb, on the volar aspect.  He also notes some scar tissue forming in the volar aspect of the ulnar hand.  This is not affecting his range of motion.  He does have a history of gout, and his primary care provider was concerned that he had developed some gout in his thumb.  He is never had a gouty flare in the thumb.  He does not think that his current pain is consistent with a gouty flare.   Review of Systems: No fevers or chills No numbness or tingling No chest pain No shortness of breath No bowel or bladder dysfunction No GI distress No headaches   Medical History:  Past Medical History:  Diagnosis Date   Barrett esophagus    GERD (gastroesophageal reflux disease)    Improved since Nissen 2007   Sarcoidosis of lung ([place])    Diagnosed on CT but has never had issues with it   Ulcer, stomach peptic    Numerous esophageal ulcers prior to Nissen    Past Surgical History:  Procedure Laterality Date   LAPAROSCOPIC NISSEN FUNDOPLICATION     VASECTOMY     VASECTOMY REVERSAL      Family History  Problem Relation Age of Onset   Heart disease Mother  Hypertension Mother    Heart disease Father    Social History   Tobacco Use   Smoking status: Never   Smokeless tobacco: Current    Types: Snuff  Substance Use Topics   Alcohol use: Yes    Alcohol/week: 3.0 standard drinks    Types: 3 drink(s) per week   Drug use: No    Allergies  Allergen Reactions   Penicillins Rash    Current Meds  Medication Sig   esomeprazole (NEXIUM) 40 MG capsule Take 1 capsule (40 mg total) by mouth daily.   lisinopril (ZESTRIL) 20 MG tablet Take 1 tablet (20 mg total) by mouth daily. Take 1 tablet (20 mg total) by mouth daily.    Objective: BP 124/86    Pulse 65    Ht 5\' 7"  (1.702 m)    Wt 182 lb (82.6 kg)    BMI 28.51 kg/m    Physical Exam:  General: Alert and oriented. and No acute distress. Gait: Normal gait.  Evaluation the right hand demonstrates no deformity.  His thumb is held in a flexed position.  He has tenderness to palpation directly over the A1 pulley.  Triggering was witnessed in clinic today.  No redness about the thumb.  No specific tenderness to palpation about the base of the thumb, with the exception of the A1 pulley.  Fingers are warm and well-perfused.  IMAGING: No new imaging obtained today  X-rays of the right thumb were obtained in clinic today.  No acute injuries are noted.  Well-maintained joint spaces at the IP joint, as well as the MCP joint.  No osteophytes are noted.  Mild degenerative changes noted at the first [facility] joint.  Impression: Mild degenerative arthritis at the first [facility] joint, negative otherwise   New Medications:  No orders of the defined types were placed in this encounter.     Mordecai Rasmussen, MD  06/26/2021 9:10 AM

## 2021-07-15 ENCOUNTER — Ambulatory Visit (INDEPENDENT_AMBULATORY_CARE_PROVIDER_SITE_OTHER): Payer: BC Managed Care – PPO | Admitting: Gastroenterology

## 2021-07-22 ENCOUNTER — Other Ambulatory Visit (INDEPENDENT_AMBULATORY_CARE_PROVIDER_SITE_OTHER): Payer: Self-pay

## 2021-07-22 ENCOUNTER — Other Ambulatory Visit: Payer: Self-pay

## 2021-07-22 ENCOUNTER — Ambulatory Visit (INDEPENDENT_AMBULATORY_CARE_PROVIDER_SITE_OTHER): Payer: BC Managed Care – PPO | Admitting: Gastroenterology

## 2021-07-22 ENCOUNTER — Encounter (INDEPENDENT_AMBULATORY_CARE_PROVIDER_SITE_OTHER): Payer: Self-pay | Admitting: Gastroenterology

## 2021-07-22 ENCOUNTER — Encounter (INDEPENDENT_AMBULATORY_CARE_PROVIDER_SITE_OTHER): Payer: Self-pay

## 2021-07-22 ENCOUNTER — Telehealth (INDEPENDENT_AMBULATORY_CARE_PROVIDER_SITE_OTHER): Payer: Self-pay

## 2021-07-22 VITALS — BP 121/80 | HR 76 | Temp 98.3°F | Ht 67.0 in | Wt 182.5 lb

## 2021-07-22 DIAGNOSIS — K219 Gastro-esophageal reflux disease without esophagitis: Secondary | ICD-10-CM

## 2021-07-22 DIAGNOSIS — K2270 Barrett's esophagus without dysplasia: Secondary | ICD-10-CM

## 2021-07-22 MED ORDER — CLENPIQ 10-3.5-12 MG-GM -GM/160ML PO SOLN: 1.0000 | Freq: Once | ORAL | 0 refills | Status: AC

## 2021-07-22 NOTE — Patient Instructions (Addendum)
Schedule EGD and colonoscopy Continue Nexium 40 mg qday Decrease beer intake Recommend to stop dipping

## 2021-07-22 NOTE — Telephone Encounter (Signed)
Marlon Vonruden Ann Carlen Rebuck, CMA  ?

## 2021-07-22 NOTE — Progress Notes (Signed)
Maylon Peppers, M.D. Gastroenterology & Hepatology [facility] [address] [place], [place] [address] Primary Care Physician: Lindell Spar, MD [address] [place] [address]  Referring MD: PCP  Chief Complaint: GERD and Barrett's esophagus  History of Present Illness: Thomas Jordan is a 61 y.o. male with PMH GERD complicated by Barrett's esophagus, sarcoidosis, who presents for evaluation of GERD and Barrett's esophagus.  Patient reports he had a longstanding history of GERD and Barrett's esophagus. He reports that he had a Nissen fundoplication 15 years ago in [place]. He is currently taking Nexium 40 mg every day compliantly. As long a she takes it he does not have any problems with heartburn or epigastric pain. No dysphagia or odynophagia.  Denies having any complaints. The patient denies having any nausea, vomiting, fever, chills, hematochezia, melena, hematemesis, abdominal distention, abdominal pain, diarrhea, jaundice, pruritus or weight loss.  Last TDD:[facility] 10 years ago, had Barrett's esophagus, no report is available  Last Colonoscopy:>10 years ago, reports he had some polyps, no report is available  FHx: neg for any gastrointestinal/liver disease, no malignancies Social: neg smoking but chews tobacco every 2 days, drinks 4 beers a night,  no illicit drug use Surgical: no abdominal surgeries  Past Medical History: Past Medical History:  Diagnosis Date   Barrett esophagus    GERD (gastroesophageal reflux disease)    Improved since Nissen 2007   Sarcoidosis of lung ([place])    Diagnosed on CT but has never had issues with it   Ulcer, stomach peptic    Numerous esophageal ulcers prior to Nissen    Past Surgical History: Past Surgical History:  Procedure Laterality Date   LAPAROSCOPIC NISSEN FUNDOPLICATION     VASECTOMY     VASECTOMY REVERSAL      Family History: Family History  Problem Relation Age of  Onset   Heart disease Mother    Hypertension Mother    Heart disease Father     Social History: Social History   Tobacco Use  Smoking Status Never  Smokeless Tobacco Current   Types: Snuff   Social History   Substance and Sexual Activity  Alcohol Use Yes   Alcohol/week: 3.0 standard drinks   Types: 3 drink(s) per week   Social History   Substance and Sexual Activity  Drug Use No    Allergies: Allergies  Allergen Reactions   Penicillins Rash    Medications: Current Outpatient Medications  Medication Sig Dispense Refill   esomeprazole (NEXIUM) 40 MG capsule Take 1 capsule (40 mg total) by mouth daily. 90 capsule 1   lisinopril (ZESTRIL) 20 MG tablet Take 1 tablet (20 mg total) by mouth daily. Take 1 tablet (20 mg total) by mouth daily. 90 tablet 1   No current facility-administered medications for this visit.    Review of Systems: GENERAL: negative for malaise, night sweats HEENT: No changes in hearing or vision, no nose bleeds or other nasal problems. NECK: Negative for lumps, goiter, pain and significant neck swelling RESPIRATORY: Negative for cough, wheezing CARDIOVASCULAR: Negative for chest pain, leg swelling, palpitations, orthopnea GI: SEE HPI MUSCULOSKELETAL: Negative for joint pain or swelling, back pain, and muscle pain. SKIN: Negative for lesions, rash PSYCH: Negative for sleep disturbance, mood disorder and recent psychosocial stressors. HEMATOLOGY Negative for prolonged bleeding, bruising easily, and swollen nodes. ENDOCRINE: Negative for cold or heat intolerance, polyuria, polydipsia and goiter. NEURO: negative for tremor, gait imbalance, syncope and seizures. The remainder of the review  of systems is noncontributory.   Physical Exam: BP 121/80 (BP Location: Left Arm, Patient Position: Sitting, Cuff Size: Large)    Pulse 76    Temp 98.3 F (36.8 C) (Oral)    Ht 5\' 7"  (1.702 m)    Wt 182 lb 8 oz (82.8 kg)    BMI 28.58 kg/m  GENERAL: The patient  is AO x3, in no acute distress. HEENT: Head is normocephalic and atraumatic. EOMI are intact. Mouth is well hydrated and without lesions. NECK: Supple. No masses LUNGS: Clear to auscultation. No presence of rhonchi/wheezing/rales. Adequate chest expansion HEART: RRR, normal s1 and s2. ABDOMEN: Soft, nontender, no guarding, no peritoneal signs, and nondistended. BS +. No masses. EXTREMITIES: Without any cyanosis, clubbing, rash, lesions or edema. NEUROLOGIC: AOx3, no focal motor deficit. SKIN: no jaundice, no rashes   Imaging/Labs: as above  I personally reviewed and interpreted the available labs, imaging and endoscopic files.  Impression and Plan: Thomas Jordan is a 61 y.o. male with PMH GERD complicated by Barrett's esophagus, sarcoidosis, who presents for evaluation of GERD and Barrett's esophagus.  The patient has presented adequate control of her is symptoms of GERD while taking Nexium 40 mg every day.  We will continue same dosage for now but he will be scheduled for an EGD for surveillance of his Barrett's esophagus.  I emphasized the importance of stopping tobacco products and decreasing his alcohol intake which he understood.  As he is also due for colorectal cancer screening, will proceed with a colonoscopy at the same time.  Patient understood and agreed.  - Schedule EGD and colonoscopy - Continue Nexium 40 mg qday - Decrease beer intake - Recommend to stop chewing tobacco - RTC 1 year  All questions were answered.      Maylon Peppers, MD Gastroenterology and Hepatology [facility]

## 2021-09-03 ENCOUNTER — Other Ambulatory Visit: Payer: Self-pay | Admitting: Internal Medicine

## 2021-09-03 DIAGNOSIS — I10 Essential (primary) hypertension: Secondary | ICD-10-CM

## 2021-09-06 ENCOUNTER — Ambulatory Visit (HOSPITAL_COMMUNITY)
Admission: RE | Admit: 2021-09-06 | Discharge: 2021-09-06 | Disposition: A | Discharge status: Home / Self Care | Payer: BC Managed Care – PPO | Attending: Gastroenterology | Admitting: Gastroenterology

## 2021-09-06 ENCOUNTER — Ambulatory Visit (HOSPITAL_COMMUNITY): Payer: BC Managed Care – PPO | Admitting: Certified Registered"

## 2021-09-06 ENCOUNTER — Encounter (HOSPITAL_COMMUNITY)
Admission: RE | Disposition: A | Discharge status: Home / Self Care | Payer: Self-pay | Source: Home / Self Care | Attending: Gastroenterology

## 2021-09-06 ENCOUNTER — Other Ambulatory Visit: Payer: Self-pay | Admitting: Internal Medicine

## 2021-09-06 ENCOUNTER — Other Ambulatory Visit: Payer: Self-pay

## 2021-09-06 ENCOUNTER — Encounter (HOSPITAL_COMMUNITY): Payer: Self-pay | Admitting: Gastroenterology

## 2021-09-06 DIAGNOSIS — D124 Benign neoplasm of descending colon: Secondary | ICD-10-CM | POA: Insufficient documentation

## 2021-09-06 DIAGNOSIS — K635 Polyp of colon: Secondary | ICD-10-CM

## 2021-09-06 DIAGNOSIS — K2270 Barrett's esophagus without dysplasia: Secondary | ICD-10-CM | POA: Insufficient documentation

## 2021-09-06 DIAGNOSIS — Z86010 Personal history of colonic polyps: Secondary | ICD-10-CM | POA: Diagnosis not present

## 2021-09-06 DIAGNOSIS — K22719 Barrett's esophagus with dysplasia, unspecified: Secondary | ICD-10-CM

## 2021-09-06 DIAGNOSIS — D122 Benign neoplasm of ascending colon: Secondary | ICD-10-CM | POA: Insufficient documentation

## 2021-09-06 DIAGNOSIS — Z1211 Encounter for screening for malignant neoplasm of colon: Secondary | ICD-10-CM | POA: Insufficient documentation

## 2021-09-06 DIAGNOSIS — K219 Gastro-esophageal reflux disease without esophagitis: Secondary | ICD-10-CM | POA: Diagnosis not present

## 2021-09-06 DIAGNOSIS — Z8711 Personal history of peptic ulcer disease: Secondary | ICD-10-CM | POA: Diagnosis not present

## 2021-09-06 HISTORY — PX: COLONOSCOPY WITH PROPOFOL: SHX5780

## 2021-09-06 HISTORY — PX: POLYPECTOMY: SHX5525

## 2021-09-06 HISTORY — PX: ESOPHAGOGASTRODUODENOSCOPY (EGD) WITH PROPOFOL: SHX5813

## 2021-09-06 HISTORY — PX: BIOPSY: SHX5522

## 2021-09-06 LAB — HM COLONOSCOPY

## 2021-09-06 SURGERY — COLONOSCOPY WITH PROPOFOL
Anesthesia: General

## 2021-09-06 MED ORDER — PROPOFOL 10 MG/ML IV BOLUS
INTRAVENOUS | Status: DC | PRN
  Administered 2021-09-06: 50 mg via INTRAVENOUS
  Administered 2021-09-06: 100 mg via INTRAVENOUS
  Administered 2021-09-06: 40 mg via INTRAVENOUS
  Administered 2021-09-06: 50 mg via INTRAVENOUS

## 2021-09-06 MED ORDER — LACTATED RINGERS IV SOLN: INTRAVENOUS | Status: DC

## 2021-09-06 MED ORDER — LACTATED RINGERS IV SOLN: INTRAVENOUS | Status: DC | PRN

## 2021-09-06 MED ORDER — LIDOCAINE HCL (CARDIAC) PF 100 MG/5ML IV SOSY
PREFILLED_SYRINGE | INTRAVENOUS | Status: DC | PRN
  Administered 2021-09-06: 50 mg via INTRAVENOUS

## 2021-09-06 MED ORDER — EPHEDRINE 5 MG/ML INJ
INTRAVENOUS | Status: AC
  Filled 2021-09-06: qty 5

## 2021-09-06 MED ORDER — PROPOFOL 500 MG/50ML IV EMUL
INTRAVENOUS | Status: DC | PRN
  Administered 2021-09-06: 150 ug/kg/min via INTRAVENOUS

## 2021-09-06 MED ORDER — EPHEDRINE SULFATE-NACL 50-0.9 MG/10ML-% IV SOSY
PREFILLED_SYRINGE | INTRAVENOUS | Status: DC | PRN
  Administered 2021-09-06 (×2): 5 mg via INTRAVENOUS

## 2021-09-06 NOTE — Op Note (Addendum)
[facility] ?Patient Name: Thomas Jordan ?Procedure Date: 09/06/2021 7:53 AM ?MRN: [id number] ?Date of Birth: 01/27/1961 ?Attending MD: Maylon Peppers ,  ?CSN: [id number] ?Age: 61 ?Admit Type: Outpatient ?Procedure:                Colonoscopy ?Indications:              Surveillance: Personal history of colonic polyps  ?                          (unknown histology) on last colonoscopy more than 5  ?                          years ago ?Providers:                Maylon Peppers, Janeece Riggers, RN, Suzan Garibaldi.  ?                          Risa Grill, Technician ?Referring MD:              ?Medicines:                Monitored Anesthesia Care ?Complications:            No immediate complications. ?Estimated Blood Loss:     Estimated blood loss: none. Estimated blood loss:  ?                          none. ?Procedure:                Pre-Anesthesia Assessment: ?                          - Prior to the procedure, a History and Physical  ?                          was performed, and patient medications, allergies  ?                          and sensitivities were reviewed. The patient's  ?                          tolerance of previous anesthesia was reviewed. ?                          - The risks and benefits of the procedure and the  ?                          sedation options and risks were discussed with the  ?                          patient. All questions were answered and informed  ?                          consent was obtained. ?                          - ASA Grade Assessment: II - A patient with mild  ?  systemic disease. ?                          After obtaining informed consent, the colonoscope  ?                          was passed under direct vision. Throughout the  ?                          procedure, the patient's blood pressure, pulse, and  ?                          oxygen saturations were monitored continuously. The  ?                          PCF-HQ190L ([id number]) scope was  introduced through  ?                          the anus and advanced to the the cecum, identified  ?                          by appendiceal orifice and ileocecal valve. The  ?                          colonoscopy was performed without difficulty. The  ?                          patient tolerated the procedure well. The quality  ?                          of the bowel preparation was good. ?Scope In: 7:56:22 AM ?Scope Out: 8:18:09 AM ?Scope Withdrawal Time: 0 hours 19 minutes 20 seconds  ?Total Procedure Duration: 0 hours 21 minutes 47 seconds  ?Findings: ?     The perianal and digital rectal examinations were normal. ?     Two sessile and semi-pedunculated polyps were found in the descending  ?     colon and ascending colon. The polyps were 4 to 8 mm in size. These  ?     polyps were removed with a cold snare. Resection and retrieval were  ?     complete. ?     The retroflexed view of the distal rectum and anal verge was normal and  ?     showed no anal or rectal abnormalities. ?Impression:               - Two 4 to 8 mm polyps in the descending colon and  ?                          in the ascending colon, removed with a cold snare.  ?                          Resected and retrieved. ?                          - The distal rectum and anal verge are normal on  ?  retroflexion view. ?Moderate Sedation: ?     Per Anesthesia Care ?Recommendation:           - Discharge patient to home (ambulatory). ?                          - Resume previous diet. ?                          - Await pathology results. ?                          - Repeat colonoscopy for surveillance based on  ?                          pathology results. ?Procedure Code(s):        --- Professional --- ?                          [phone], Colonoscopy, flexible; with removal of  ?                          tumor(s), polyp(s), or other lesion(s) by snare  ?                          technique ?Diagnosis Code(s):        --- Professional  --- ?                          Z86.010, Personal history of colonic polyps ?                          K63.5, Polyp of colon ?CPT copyright 2019 [employer] Medical Association. All rights reserved. ?The codes documented in this report are preliminary and upon coder review may  ?be revised to meet current compliance requirements. ?Maylon Peppers, MD ?Maylon Peppers,  ?09/06/2021 8:30:26 AM ?This report has been signed electronically. ?Number of Addenda: 0 ?

## 2021-09-06 NOTE — Transfer of Care (Signed)
Immediate Anesthesia Transfer of Care Note ? ?Patient: Bear Osten ? ?Procedure(s) Performed: COLONOSCOPY WITH PROPOFOL ?ESOPHAGOGASTRODUODENOSCOPY (EGD) WITH PROPOFOL ?BIOPSY ?POLYPECTOMY ? ?Patient Location: Endoscopy Unit ? ?Anesthesia Type:General ? ?Level of Consciousness: awake and oriented ? ?Airway & Oxygen Therapy: Patient Spontanous Breathing ? ?Post-op Assessment: Report given to RN and Post -op Vital signs reviewed and stable ? ?Post vital signs: Reviewed and stable ? ?Last Vitals:  ?Vitals Value Taken Time  ?BP    ?Temp    ?Pulse    ?Resp    ?SpO2    ? ? ?Last Pain:  ?Vitals:  ? 09/06/21 0735  ?TempSrc:   ?PainSc: 0-No pain  ?   ? ?Patients Stated Pain Goal: 7 (09/06/21 8648) ? ?Complications: No notable events documented. ?

## 2021-09-06 NOTE — Op Note (Signed)
[facility] ?Patient Name: Thomas Jordan ?Procedure Date: 09/06/2021 7:13 AM ?MRN: [id number] ?Date of Birth: 06/04/1961 ?Attending MD: Maylon Peppers ,  ?CSN: [id number] ?Age: 61 ?Admit Type: Outpatient ?Procedure:                Upper GI endoscopy ?Indications:              Follow-up of Barrett's esophagus ?Providers:                Maylon Peppers, Janeece Riggers, RN, Suzan Garibaldi.  ?                          Risa Grill, Technician ?Referring MD:              ?Medicines:                Monitored Anesthesia Care ?Complications:            No immediate complications. ?Estimated Blood Loss:     Estimated blood loss: none. ?Procedure:                Pre-Anesthesia Assessment: ?                          - Prior to the procedure, a History and Physical  ?                          was performed, and patient medications, allergies  ?                          and sensitivities were reviewed. The patient's  ?                          tolerance of previous anesthesia was reviewed. ?                          - The risks and benefits of the procedure and the  ?                          sedation options and risks were discussed with the  ?                          patient. All questions were answered and informed  ?                          consent was obtained. ?                          - ASA Grade Assessment: II - A patient with mild  ?                          systemic disease. ?                          After obtaining informed consent, the endoscope was  ?                          passed under direct vision. Throughout the  ?  procedure, the patient's blood pressure, pulse, and  ?                          oxygen saturations were monitored continuously. The  ?                          GIF-H190 ([id number]) scope was introduced through the  ?                          mouth, and advanced to the second part of duodenum.  ?                          The upper GI endoscopy was accomplished without  ?                           difficulty. The patient tolerated the procedure  ?                          well. ?Scope In: 7:35:36 AM ?Scope Out: 7:51:41 AM ?Total Procedure Duration: 0 hours 16 minutes 5 seconds  ?Findings: ?     The esophagus and gastroesophageal junction were examined with white  ?     light and narrow band imaging (NBI). There were esophageal mucosal  ?     changes consistent with long-segment Barrett's esophagus, classified as  ?     Barrett's stage C1-M5 per Prague criteria. These changes involved the  ?     mucosa at the upper extent of the gastric folds (40 cm from the  ?     incisors) extending to the Z-line (35 cm from the incisors). The maximum  ?     longitudinal extent of these esophageal mucosal changes was 5 cm in  ?     length. Mucosa was biopsied with a cold forceps for histology in 4  ?     quadrants at intervals of 1 cm. A total of 5 specimen bottles were sent  ?     to pathology. ?     The entire examined stomach was normal. ?     The examined duodenum was normal. ?Impression:               - Esophageal mucosal changes consistent with  ?                          long-segment Barrett's esophagus, classified as  ?                          Barrett's stage C1-M5 per Prague criteria. Biopsied. ?                          - Normal stomach. ?                          - Normal examined duodenum. ?Moderate Sedation: ?     Per Anesthesia Care ?Recommendation:           - Discharge patient to home (ambulatory). ?                          -  Resume previous diet. ?                          - Await pathology results. ?                          - Continue present medications. ?                          - Repeat upper endoscopy after studies are complete  ?                          for surveillance of Barrett's esophagus. ?Procedure Code(s):        --- Professional --- ?                          [phone], Esophagogastroduodenoscopy, flexible,  ?                          transoral; with biopsy, single or  multiple ?Diagnosis Code(s):        --- Professional --- ?                          K22.70, Barrett's esophagus without dysplasia ?CPT copyright 2019 American Medical Association. All rights reserved. ?The codes documented in this report are preliminary and upon coder review may  ?be revised to meet current compliance requirements. ?Maylon Peppers, MD ?Maylon Peppers,  ?09/06/2021 8:24:57 AM ?This report has been signed electronically. ?Number of Addenda: 0 ?

## 2021-09-06 NOTE — Anesthesia Postprocedure Evaluation (Signed)
Anesthesia Post Note ? ?Patient: Thomas Jordan ? ?Procedure(s) Performed: COLONOSCOPY WITH PROPOFOL ?ESOPHAGOGASTRODUODENOSCOPY (EGD) WITH PROPOFOL ?BIOPSY ?POLYPECTOMY ? ?Patient location during evaluation: Phase II ?Anesthesia Type: General ?Level of consciousness: awake ?Pain management: pain level controlled ?Vital Signs Assessment: post-procedure vital signs reviewed and stable ?Respiratory status: spontaneous breathing and respiratory function stable ?Cardiovascular status: blood pressure returned to baseline and stable ?Postop Assessment: no headache and no apparent nausea or vomiting ?Anesthetic complications: no ?Comments: Late entry ? ? ?No notable events documented. ? ? ?Last Vitals:  ?Vitals:  ? 09/06/21 0649 09/06/21 0821  ?BP: 137/72 111/70  ?Pulse: 66   ?Resp: 19 17  ?Temp: 36.5 ?C 36.5 ?C  ?SpO2: 98% 100%  ?  ?Last Pain:  ?Vitals:  ? 09/06/21 0821  ?TempSrc: Oral  ?PainSc: 0-No pain  ? ? ?  ?  ?  ?  ?  ?  ? ?Louann Sjogren ? ? ? ? ?

## 2021-09-06 NOTE — Anesthesia Preprocedure Evaluation (Signed)
Anesthesia Evaluation  ?Patient identified by MRN, date of birth, ID band ?Patient awake ? ? ? ?Reviewed: ?Allergy & Precautions, H&P , NPO status , Patient's Chart, lab work & pertinent test results, reviewed documented beta blocker date and time  ? ?Airway ?Mallampati: II ? ?TM Distance: >3 FB ?Neck ROM: full ? ? ? Dental ?no notable dental hx. ? ?  ?Pulmonary ?neg pulmonary ROS,  ?  ?Pulmonary exam normal ?breath sounds clear to auscultation ? ? ? ? ? ? Cardiovascular ?Exercise Tolerance: Good ?negative cardio ROS ? ? ?Rhythm:regular Rate:Normal ? ? ?  ?Neuro/Psych ?negative neurological ROS ? negative psych ROS  ? GI/Hepatic ?Neg liver ROS, PUD, GERD  Medicated,  ?Endo/Other  ?negative endocrine ROS ? Renal/GU ?negative Renal ROS  ?negative genitourinary ?  ?Musculoskeletal ? ? Abdominal ?  ?Peds ? Hematology ?negative hematology ROS ?(+)   ?Anesthesia Other Findings ? ? Reproductive/Obstetrics ?negative OB ROS ? ?  ? ? ? ? ? ? ? ? ? ? ? ? ? ?  ?  ? ? ? ? ? ? ? ? ?Anesthesia Physical ?Anesthesia Plan ? ?ASA: 2 ? ?Anesthesia Plan: General  ? ?Post-op Pain Management:   ? ?Induction:  ? ?PONV Risk Score and Plan: Propofol infusion ? ?Airway Management Planned:  ? ?Additional Equipment:  ? ?Intra-op Plan:  ? ?Post-operative Plan:  ? ?Informed Consent: I have reviewed the patients History and Physical, chart, labs and discussed the procedure including the risks, benefits and alternatives for the proposed anesthesia with the patient or authorized representative who has indicated his/her understanding and acceptance.  ? ? ? ?Dental Advisory Given ? ?Plan Discussed with: CRNA ? ?Anesthesia Plan Comments:   ? ? ? ? ? ? ?Anesthesia Quick Evaluation ? ?

## 2021-09-06 NOTE — H&P (Signed)
Thomas Jordan is an 62 y.o. male.   ?Chief Complaint: screening Barrett's esophagus and history of colon polyps ?HPI: Thomas Jordan is a 61 y.o. male with PMH GERD complicated by Barrett's esophagus, sarcoidosis, who presents for evaluation of history of colon polyps and Barrett's esophagus. ? ?Last JQZ:ESPQ 10 years ago, had Barrett's esophagus, no report is available  ?Last Colonoscopy:>10 years ago, reports he had some polyps, no report is available ? ?Past Medical History:  ?Diagnosis Date  ? Barrett esophagus   ? GERD (gastroesophageal reflux disease)   ? Improved since Nissen 2007  ? Sarcoidosis of lung ([place])   ? Diagnosed on CT but has never had issues with it  ? Ulcer, stomach peptic   ? Numerous esophageal ulcers prior to Nissen  ? ? ?Past Surgical History:  ?Procedure Laterality Date  ? LAPAROSCOPIC NISSEN FUNDOPLICATION    ? VASECTOMY    ? VASECTOMY REVERSAL    ? ? ?Family History  ?Problem Relation Age of Onset  ? Heart disease Mother   ? Hypertension Mother   ? Heart disease Father   ? ?Social History:  reports that he has never smoked. His smokeless tobacco use includes snuff. He reports current alcohol use of about 3.0 standard drinks per week. He reports that he does not use drugs. ? ?Allergies:  ?Allergies  ?Allergen Reactions  ? Penicillins Rash  ? ? ?Medications Prior to Admission  ?Medication Sig Dispense Refill  ? acetaminophen (TYLENOL) 500 MG tablet Take 1,000 mg by mouth every 6 (six) hours as needed for moderate pain.    ? esomeprazole (NEXIUM) 40 MG capsule Take 1 capsule (40 mg total) by mouth daily. 90 capsule 1  ? lisinopril (ZESTRIL) 20 MG tablet TAKE 1 TABLET(20 MG) BY MOUTH DAILY 90 tablet 1  ? ? ?No results found for this or any previous visit (from the past 48 hour(s)). ?No results found. ? ?Review of Systems  ?Constitutional: Negative.   ?HENT: Negative.    ?Eyes: Negative.   ?Respiratory: Negative.    ?Cardiovascular: Negative.   ?Gastrointestinal: Negative.   ?Endocrine:  Negative.   ?Genitourinary: Negative.   ?Musculoskeletal: Negative.   ?Skin: Negative.   ?Allergic/Immunologic: Negative.   ?Neurological: Negative.   ?Hematological: Negative.   ?Psychiatric/Behavioral: Negative.    ? ?Blood pressure 137/72, pulse 66, temperature 97.7 ?F (36.5 ?C), temperature source Oral, resp. rate 19, height '5\' 7"'$  (1.702 m), weight 78.9 kg, SpO2 98 %. ?Physical Exam  ?GENERAL: The patient is AO x3, in no acute distress. ?HEENT: Head is normocephalic and atraumatic. EOMI are intact. Mouth is well hydrated and without lesions. ?NECK: Supple. No masses ?LUNGS: Clear to auscultation. No presence of rhonchi/wheezing/rales. Adequate chest expansion ?HEART: RRR, normal s1 and s2. ?ABDOMEN: Soft, nontender, no guarding, no peritoneal signs, and nondistended. BS +. No masses. ?EXTREMITIES: Without any cyanosis, clubbing, rash, lesions or edema. ?NEUROLOGIC: AOx3, no focal motor deficit. ?SKIN: no jaundice, no rashes ? ?Assessment/Plan ?Thomas Jordan is a 61 y.o. male with PMH GERD complicated by Barrett's esophagus, sarcoidosis, who presents for evaluation of history of colon polyps and Barrett's esophagus. We will proceed with EGD and colonoscopy ? ?Harvel Quale, MD ?09/06/2021, 7:28 AM ? ? ? ?

## 2021-09-06 NOTE — Anesthesia Procedure Notes (Signed)
Date/Time: 09/06/2021 7:43 AM ?Performed by: Orlie Dakin, CRNA ?Pre-anesthesia Checklist: Patient identified, Emergency Drugs available, Suction available and Patient being monitored ?Patient Re-evaluated:Patient Re-evaluated prior to induction ?Oxygen Delivery Method: Nasal cannula ?Induction Type: IV induction ?Placement Confirmation: positive ETCO2 ? ? ? ? ?

## 2021-09-06 NOTE — Discharge Instructions (Addendum)
You are being discharged to home.  ?Resume your previous diet.  ?We are waiting for your pathology results.  ?Continue your present medications.  ?Your physician has recommended a repeat upper endoscopy after studies are complete for surveillance of Barrett's esophagus.  ?Your physician has recommended a repeat colonoscopy for surveillance based on pathology results.  ?

## 2021-09-09 LAB — SURGICAL PATHOLOGY

## 2021-09-10 ENCOUNTER — Encounter (HOSPITAL_COMMUNITY): Payer: Self-pay | Admitting: Gastroenterology

## 2021-09-16 ENCOUNTER — Encounter (INDEPENDENT_AMBULATORY_CARE_PROVIDER_SITE_OTHER): Payer: Self-pay | Admitting: *Deleted

## 2021-10-11 ENCOUNTER — Encounter: Payer: Self-pay | Admitting: Internal Medicine

## 2021-10-11 ENCOUNTER — Ambulatory Visit (INDEPENDENT_AMBULATORY_CARE_PROVIDER_SITE_OTHER): Payer: BC Managed Care – PPO | Admitting: Internal Medicine

## 2021-10-11 VITALS — BP 138/86 | HR 59 | Resp 18 | Ht 67.0 in | Wt 181.8 lb

## 2021-10-11 DIAGNOSIS — Z125 Encounter for screening for malignant neoplasm of prostate: Secondary | ICD-10-CM

## 2021-10-11 DIAGNOSIS — Z23 Encounter for immunization: Secondary | ICD-10-CM

## 2021-10-11 DIAGNOSIS — I10 Essential (primary) hypertension: Secondary | ICD-10-CM | POA: Diagnosis not present

## 2021-10-11 DIAGNOSIS — Z0001 Encounter for general adult medical examination with abnormal findings: Secondary | ICD-10-CM

## 2021-10-11 DIAGNOSIS — K2270 Barrett's esophagus without dysplasia: Secondary | ICD-10-CM

## 2021-10-11 DIAGNOSIS — G250 Essential tremor: Secondary | ICD-10-CM

## 2021-10-11 MED ORDER — PROPRANOLOL HCL 20 MG PO TABS: 20.0000 mg | ORAL_TABLET | Freq: Two times a day (BID) | ORAL | 5 refills | Status: DC

## 2021-10-11 NOTE — Progress Notes (Signed)
? ?Established Patient Office Visit ? ?Subjective:  ?Patient ID: Thomas Jordan, male    DOB: January 30, 1961  Age: 61 y.o. MRN: [id number] ? ?CC:  ?Chief Complaint  ?Patient presents with  ? Annual Exam  ?  Annual exam   ? ? ?HPI ?Thomas Jordan is a 61 y.o. male with past medical history of HTN, Barett's esophagus and sarcoidosis who presents for annual physical. ? ?HTN: BP is well-controlled. Takes medications regularly. Patient denies headache, dizziness, chest pain, dyspnea or palpitations. ? ?Barrett's esophagus: On Nexium.  Followed by GI. Recently had GI. ? ?He has mild hand tremors upon activity.  He denies any resting tremor.  He has difficulty doing his routine work at times due to tremors.  He does admit that he has 2 beers per day, but agrees to cut down.  He has 1 to 2 cups of coffee per day.  Denies any smoking. ? ?He received first dose of Shingrix and Tdap vaccines today. ? ?Past Medical History:  ?Diagnosis Date  ? Barrett esophagus   ? GERD (gastroesophageal reflux disease)   ? Improved since Nissen 2007  ? Sarcoidosis of lung ([place])   ? Diagnosed on CT but has never had issues with it  ? Ulcer, stomach peptic   ? Numerous esophageal ulcers prior to Nissen  ? ? ?Past Surgical History:  ?Procedure Laterality Date  ? BIOPSY  09/06/2021  ? Procedure: BIOPSY;  Surgeon: Montez Morita, Quillian Quince, MD;  [facility]: [facility];  Service: Gastroenterology;;  ? COLONOSCOPY WITH PROPOFOL N/A 09/06/2021  ? Procedure: COLONOSCOPY WITH PROPOFOL;  Surgeon: Harvel Quale, MD;  Location: [facility];  Service: Gastroenterology;  Laterality: N/A;  815, pt know to arrive at 6:30  ? ESOPHAGOGASTRODUODENOSCOPY (EGD) WITH PROPOFOL N/A 09/06/2021  ? Procedure: ESOPHAGOGASTRODUODENOSCOPY (EGD) WITH PROPOFOL;  Surgeon: Harvel Quale, MD;  [facility]: [facility];  Service: Gastroenterology;  Laterality: N/A;  ? LAPAROSCOPIC NISSEN FUNDOPLICATION    ? POLYPECTOMY  09/06/2021  ? Procedure: POLYPECTOMY;   Surgeon: Harvel Quale, MD;  Location: [facility];  Service: Gastroenterology;;  ? VASECTOMY    ? VASECTOMY REVERSAL    ? ? ?Family History  ?Problem Relation Age of Onset  ? Heart disease Mother   ? Hypertension Mother   ? Heart disease Father   ? ? ?Social History  ? ?Socioeconomic History  ? Marital status: Married  ?  Spouse name: Not on file  ? Number of children: Not on file  ? Years of education: Not on file  ? Highest education level: Not on file  ?Occupational History  ? Not on file  ?Tobacco Use  ? Smoking status: Never  ? Smokeless tobacco: Current  ?  Types: Snuff  ?Vaping Use  ? Vaping Use: Never used  ?Substance and Sexual Activity  ? Alcohol use: Yes  ?  Alcohol/week: 3.0 standard drinks  ?  Types: 3 drink(s) per week  ? Drug use: No  ? Sexual activity: Not on file  ?Other Topics Concern  ? Not on file  ?Social History Narrative  ? Not on file  ? ?Social Determinants of Health  ? ?Financial Resource Strain: Not on file  ?Food Insecurity: Not on file  ?Transportation Needs: Not on file  ?Physical Activity: Not on file  ?Stress: Not on file  ?Social Connections: Not on file  ?Intimate Partner Violence: Not on file  ? ? ?Outpatient Medications Prior to Visit  ?Medication Sig Dispense Refill  ? acetaminophen (  TYLENOL) 500 MG tablet Take 1,000 mg by mouth every 6 (six) hours as needed for moderate pain.    ? esomeprazole (NEXIUM) 40 MG capsule TAKE 1 CAPSULE(40 MG) BY MOUTH DAILY 90 capsule 1  ? lisinopril (ZESTRIL) 20 MG tablet TAKE 1 TABLET(20 MG) BY MOUTH DAILY 90 tablet 1  ? ?No facility-administered medications prior to visit.  ? ? ?Allergies  ?Allergen Reactions  ? Penicillins Rash  ? ? ?ROS ?Review of Systems  ?Constitutional:  Negative for chills and fever.  ?HENT:  Negative for congestion and sore throat.   ?Eyes:  Negative for pain and discharge.  ?Respiratory:  Negative for cough and shortness of breath.   ?Cardiovascular:  Negative for chest pain and palpitations.   ?Gastrointestinal:  Negative for diarrhea, nausea and vomiting.  ?Endocrine: Negative for polydipsia and polyuria.  ?Genitourinary:  Negative for dysuria and hematuria.  ?Musculoskeletal:  Negative for neck pain and neck stiffness.  ?Skin:  Negative for rash.  ?Neurological:  Positive for tremors. Negative for dizziness, weakness and numbness.  ?Psychiatric/Behavioral:  Negative for agitation and behavioral problems.   ? ?  ?Objective:  ?  ?Physical Exam ?Vitals reviewed.  ?Constitutional:   ?   General: He is not in acute distress. ?   Appearance: He is not diaphoretic.  ?HENT:  ?   Head: Normocephalic and atraumatic.  ?   Nose: Nose normal.  ?   Mouth/Throat:  ?   Mouth: Mucous membranes are moist.  ?Eyes:  ?   General: No scleral icterus. ?   Extraocular Movements: Extraocular movements intact.  ?Cardiovascular:  ?   Rate and Rhythm: Normal rate and regular rhythm.  ?   Pulses: Normal pulses.  ?   Heart sounds: Normal heart sounds. No murmur heard. ?Pulmonary:  ?   Breath sounds: Normal breath sounds. No wheezing or rales.  ?Abdominal:  ?   Palpations: Abdomen is soft.  ?   Tenderness: There is no abdominal tenderness.  ?Musculoskeletal:  ?   Cervical back: Neck supple. No tenderness.  ?   Right lower leg: No edema.  ?   Left lower leg: No edema.  ?Skin: ?   General: Skin is warm.  ?   Findings: No erythema.  ?Neurological:  ?   General: No focal deficit present.  ?   Mental Status: He is alert and oriented to person, place, and time.  ?   Cranial Nerves: No cranial nerve deficit.  ?   Sensory: No sensory deficit.  ?   Motor: Tremor (B/l hands) present. No weakness.  ?Psychiatric:     ?   Mood and Affect: Mood normal.     ?   Behavior: Behavior normal.  ? ? ?BP 138/86 (BP Location: Right Arm, Patient Position: Sitting, Cuff Size: Normal)   Pulse (!) 59   Resp 18   Ht 5' 7"  (1.702 m)   Wt 181 lb 12.8 oz (82.5 kg)   SpO2 98%   BMI 28.47 kg/m?  ?Wt Readings from Last 3 Encounters:  ?10/11/21 181 lb 12.8 oz  (82.5 kg)  ?09/06/21 174 lb (78.9 kg)  ?07/22/21 182 lb 8 oz (82.8 kg)  ? ? ?Lab Results  ?Component Value Date  ? TSH 1.310 03/01/2021  ? ?Lab Results  ?Component Value Date  ? WBC 5.3 03/01/2021  ? HGB 14.2 03/01/2021  ? HCT 42.7 03/01/2021  ? MCV 93 03/01/2021  ? PLT 224 03/01/2021  ? ?Lab Results  ?Component Value Date  ?  NA 138 03/01/2021  ? K 5.2 03/01/2021  ? CO2 22 03/01/2021  ? GLUCOSE 89 03/01/2021  ? BUN 14 03/01/2021  ? CREATININE 1.21 03/01/2021  ? BILITOT 0.6 03/01/2021  ? ALKPHOS 64 03/01/2021  ? AST 13 03/01/2021  ? ALT 9 03/01/2021  ? PROT 6.4 03/01/2021  ? ALBUMIN 4.2 03/01/2021  ? CALCIUM 9.7 03/01/2021  ? EGFR 68 03/01/2021  ? ?Lab Results  ?Component Value Date  ? CHOL 184 03/01/2021  ? ?Lab Results  ?Component Value Date  ? HDL 65 03/01/2021  ? ?Lab Results  ?Component Value Date  ? LDLCALC 109 (H) 03/01/2021  ? ?Lab Results  ?Component Value Date  ? TRIG 54 03/01/2021  ? ?Lab Results  ?Component Value Date  ? CHOLHDL 2.8 03/01/2021  ? ?Lab Results  ?Component Value Date  ? HGBA1C 5.5 03/01/2021  ? ? ?  ?Assessment & Plan:  ? ?Problem List Items Addressed This Visit   ? ?  ? Cardiovascular and Mediastinum  ? Essential hypertension  ?  BP Readings from Last 1 Encounters:  ?10/11/21 138/86  ?Well controlled with lisinopril 20 mg QD ?Counseled for compliance with the medications ?Advised DASH diet and moderate exercise/walking, at least 150 mins/week ?  ?  ? Relevant Medications  ? propranolol (INDERAL) 20 MG tablet  ?  ? Digestive  ? Barrett's esophagus  ?  S/p Nissen procedure in 2007 ?On Nexium ?Followed by GI ? ?  ?  ?  ? Nervous and Auditory  ? Essential tremor  ?  Needs to cut down alcohol use ?Sleep hygiene is discussed ?Started propranolol 20 mg BID for now, may help with anxiety and slightly elevated BP ? ?  ?  ? Relevant Medications  ? propranolol (INDERAL) 20 MG tablet  ?  ? Other  ? Encounter for general adult medical examination with abnormal findings - Primary  ?  Physical exam as  documented. ?Counseling done  re healthy lifestyle involving commitment to 150 minutes exercise per week, heart healthy diet, and attaining healthy weight.The importance of adequate sleep also discussed. ?Changes in health habits a

## 2021-10-11 NOTE — Assessment & Plan Note (Signed)

## 2021-10-11 NOTE — Assessment & Plan Note (Signed)
BP Readings from Last 1 Encounters:  ?10/11/21 138/86  ? ?Well controlled with lisinopril 20 mg QD ?Counseled for compliance with the medications ?Advised DASH diet and moderate exercise/walking, at least 150 mins/week ?

## 2021-10-11 NOTE — Assessment & Plan Note (Signed)
Needs to cut down alcohol use ?Sleep hygiene is discussed ?Started propranolol 20 mg BID for now, may help with anxiety and slightly elevated BP ?

## 2021-10-11 NOTE — Assessment & Plan Note (Signed)
S/p Nissen procedure in 2007 On Nexium Followed by GI 

## 2021-10-11 NOTE — Assessment & Plan Note (Signed)
Ordered PSA after discussing its limitations for prostate cancer screening, including false positive results leading additional investigations. 

## 2021-10-11 NOTE — Patient Instructions (Signed)
Please start taking Propranolol for tremors. ? ?Please continue taking medications as prescribed. ? ?Please continue to follow low salt diet and perform moderate exercise/walking at least 150 mins/week. ?

## 2021-10-12 LAB — CMP14+EGFR
ALT: 14 IU/L (ref 0–44)
AST: 18 IU/L (ref 0–40)
Albumin/Globulin Ratio: 1.9 (ref 1.2–2.2)
Albumin: 4.6 g/dL (ref 3.8–4.8)
Alkaline Phosphatase: 66 IU/L (ref 44–121)
BUN/Creatinine Ratio: 12 (ref 10–24)
BUN: 13 mg/dL (ref 8–27)
Bilirubin Total: 0.4 mg/dL (ref 0.0–1.2)
CO2: 24 mmol/L (ref 20–29)
Calcium: 10 mg/dL (ref 8.6–10.2)
Chloride: 103 mmol/L (ref 96–106)
Creatinine, Ser: 1.08 mg/dL (ref 0.76–1.27)
Globulin, Total: 2.4 g/dL (ref 1.5–4.5)
Glucose: 95 mg/dL (ref 70–99)
Potassium: 5.2 mmol/L (ref 3.5–5.2)
Sodium: 140 mmol/L (ref 134–144)
Total Protein: 7 g/dL (ref 6.0–8.5)
eGFR: 78 mL/min/{1.73_m2} (ref >59)

## 2021-10-12 LAB — CBC WITH DIFFERENTIAL/PLATELET
Basophils Absolute: 0.1 10*3/uL (ref 0.0–0.2)
Basos: 1 %
EOS (ABSOLUTE): 0.5 10*3/uL — ABNORMAL HIGH (ref 0.0–0.4)
Eos: 9 %
Hematocrit: 42.9 % (ref 37.5–51.0)
Hemoglobin: 14.3 g/dL (ref 13.0–17.7)
Immature Grans (Abs): 0 10*3/uL (ref 0.0–0.1)
Immature Granulocytes: 1 %
Lymphocytes Absolute: 1.1 10*3/uL (ref 0.7–3.1)
Lymphs: 18 %
MCH: 31.4 pg (ref 26.6–33.0)
MCHC: 33.3 g/dL (ref 31.5–35.7)
MCV: 94 fL (ref 79–97)
Monocytes Absolute: 0.6 10*3/uL (ref 0.1–0.9)
Monocytes: 11 %
Neutrophils Absolute: 3.6 10*3/uL (ref 1.4–7.0)
Neutrophils: 60 %
Platelets: 262 10*3/uL (ref 150–450)
RBC: 4.56 x10E6/uL (ref 4.14–5.80)
RDW: 11.8 % (ref 11.6–15.4)
WBC: 5.9 10*3/uL (ref 3.4–10.8)

## 2021-10-12 LAB — TSH: TSH: 0.966 u[IU]/mL (ref 0.450–4.500)

## 2021-10-12 LAB — LIPID PANEL
Chol/HDL Ratio: 3 ratio (ref 0.0–5.0)
Cholesterol, Total: 199 mg/dL (ref 100–199)
HDL: 67 mg/dL (ref >39)
LDL Chol Calc (NIH): 118 mg/dL — ABNORMAL HIGH (ref 0–99)
Triglycerides: 76 mg/dL (ref 0–149)
VLDL Cholesterol Cal: 14 mg/dL (ref 5–40)

## 2021-10-12 LAB — HEMOGLOBIN A1C
Est. average glucose Bld gHb Est-mCnc: 97 mg/dL
Hgb A1c MFr Bld: 5 % (ref 4.8–5.6)

## 2021-10-12 LAB — PSA: Prostate Specific Ag, Serum: 0.6 ng/mL (ref 0.0–4.0)

## 2021-10-15 ENCOUNTER — Telehealth: Payer: Self-pay | Admitting: Internal Medicine

## 2021-10-15 NOTE — Telephone Encounter (Signed)
Patient return call for lab results. ? ?Patient is working can leave detailed messages with wife as well on cell phone. ?

## 2021-10-16 NOTE — Telephone Encounter (Signed)
ADDED NOTE TO CHART TO PUT DPR ON FILE FOR PATIENT ON NEXT VISIT. ?

## 2022-03-03 ENCOUNTER — Other Ambulatory Visit: Payer: Self-pay | Admitting: Internal Medicine

## 2022-03-03 DIAGNOSIS — K22719 Barrett's esophagus with dysplasia, unspecified: Secondary | ICD-10-CM

## 2022-03-04 ENCOUNTER — Other Ambulatory Visit: Payer: Self-pay | Admitting: Internal Medicine

## 2022-03-04 DIAGNOSIS — I10 Essential (primary) hypertension: Secondary | ICD-10-CM

## 2022-04-18 ENCOUNTER — Encounter: Payer: Self-pay | Admitting: Internal Medicine

## 2022-04-18 ENCOUNTER — Ambulatory Visit: Payer: BC Managed Care – PPO | Admitting: Internal Medicine

## 2022-04-18 VITALS — BP 144/90 | HR 49 | Ht 67.0 in | Wt 186.4 lb

## 2022-04-18 DIAGNOSIS — I10 Essential (primary) hypertension: Secondary | ICD-10-CM | POA: Diagnosis not present

## 2022-04-18 DIAGNOSIS — G250 Essential tremor: Secondary | ICD-10-CM | POA: Diagnosis not present

## 2022-04-18 DIAGNOSIS — K2270 Barrett's esophagus without dysplasia: Secondary | ICD-10-CM | POA: Diagnosis not present

## 2022-04-18 DIAGNOSIS — Z23 Encounter for immunization: Secondary | ICD-10-CM

## 2022-04-18 MED ORDER — LISINOPRIL 30 MG PO TABS: 30.0000 mg | ORAL_TABLET | Freq: Every day | ORAL | 1 refills | Status: DC

## 2022-04-18 NOTE — Assessment & Plan Note (Signed)
S/p Nissen procedure in 2007 On Nexium Followed by GI

## 2022-04-18 NOTE — Patient Instructions (Signed)
Please start taking Lisinopril 30 mg instead of 20 mg.  Please check your BP at home and bring your log in the next visit.  Please continue to follow low salt diet and perform moderate exercise/walking at least 150 mins/week.  Please get blood test done before the next visit.

## 2022-04-18 NOTE — Assessment & Plan Note (Signed)
Has cut down alcohol use Sleep hygiene is discussed Had started propranolol 20 mg BID to help with anxiety and slightly elevated BP, but has been taking it QD

## 2022-04-18 NOTE — Assessment & Plan Note (Signed)
BP Readings from Last 1 Encounters:  04/18/22 (!) 144/90   Uncontrolled with lisinopril 20 mg QD and Propranolol 20 mg QD (for tremors) Increased dose of lisinopril to 30 mg QD Counseled for compliance with the medications Advised DASH diet and moderate exercise/walking, at least 150 mins/week

## 2022-04-18 NOTE — Progress Notes (Signed)
Established Patient Office Visit  Subjective:  Patient ID: Thomas Jordan, male    DOB: 23-Aug-1960  Age: 61 y.o. MRN: [id number]  CC:  Chief Complaint  Patient presents with   Follow-up    Follow up bp and tremors    HPI Thomas Jordan is a 61 y.o. male with past medical history of HTN, Barett's esophagus and sarcoidosis who presents for f/u of his chronic medical conditions.  HTN: BP is elevated today.  He reports that he has been taking propranolol only once daily as he was having agitation while taking lisinopril and propranolol twice daily.  He reports that his BP was recently high when he had checkup for his insurance.  He denies any headache, dizziness, chest pain, dyspnea or palpitations.  Tremors: Overall controlled with propranolol, could not continue twice daily dosing.  GERD: Well-controlled with Nexium.  Denies any nausea, vomiting, dysphagia or odynophagia.   Past Medical History:  Diagnosis Date   Barrett esophagus    GERD (gastroesophageal reflux disease)    Improved since Nissen 2007   Sarcoidosis of lung ([place])    Diagnosed on CT but has never had issues with it   Ulcer, stomach peptic    Numerous esophageal ulcers prior to Nissen    Past Surgical History:  Procedure Laterality Date   BIOPSY  09/06/2021   Procedure: BIOPSY;  Surgeon: Harvel Quale, MD;  [facility];  Service: Gastroenterology;;   COLONOSCOPY WITH PROPOFOL N/A 09/06/2021   Procedure: COLONOSCOPY WITH PROPOFOL;  Surgeon: Harvel Quale, MD;  [facility];  Service: Gastroenterology;  Laterality: N/A;  815, pt know to arrive at 6:30   ESOPHAGOGASTRODUODENOSCOPY (EGD) WITH PROPOFOL N/A 09/06/2021   Procedure: ESOPHAGOGASTRODUODENOSCOPY (EGD) WITH PROPOFOL;  Surgeon: Harvel Quale, MD;  [facility];  Service: Gastroenterology;  Laterality: N/A;   LAPAROSCOPIC NISSEN FUNDOPLICATION     POLYPECTOMY  09/06/2021   Procedure:  POLYPECTOMY;  Surgeon: Harvel Quale, MD;  [facility];  Service: Gastroenterology;;   VASECTOMY     VASECTOMY REVERSAL      Family History  Problem Relation Age of Onset   Heart disease Mother    Hypertension Mother    Heart disease Father     Social History   Socioeconomic History   Marital status: Married    Spouse name: Not on file   Number of children: Not on file   Years of education: Not on file   Highest education level: Not on file  Occupational History   Not on file  Tobacco Use   Smoking status: Never   Smokeless tobacco: Current    Types: Snuff  Vaping Use   Vaping Use: Never used  Substance and Sexual Activity   Alcohol use: Yes    Alcohol/week: 3.0 standard drinks of alcohol    Types: 3 drink(s) per week   Drug use: No   Sexual activity: Not on file  Other Topics Concern   Not on file  Social History Narrative   Not on file   Social Determinants of Health   Financial Resource Strain: Not on file  Food Insecurity: Not on file  Transportation Needs: Not on file  Physical Activity: Not on file  Stress: Not on file  Social Connections: Not on file  Intimate Partner Violence: Not on file    Outpatient Medications Prior to Visit  Medication Sig Dispense Refill   acetaminophen (TYLENOL) 500 MG tablet Take 1,000 mg by  mouth every 6 (six) hours as needed for moderate pain.     esomeprazole (NEXIUM) 40 MG capsule TAKE 1 CAPSULE(40 MG) BY MOUTH DAILY 90 capsule 1   propranolol (INDERAL) 20 MG tablet Take 1 tablet (20 mg total) by mouth 2 (two) times daily. (Patient taking differently: Take 20 mg by mouth daily.) 60 tablet 5   lisinopril (ZESTRIL) 20 MG tablet TAKE 1 TABLET(20 MG) BY MOUTH DAILY 90 tablet 1   No facility-administered medications prior to visit.    Allergies  Allergen Reactions   Penicillins Rash    ROS Review of Systems  Constitutional:  Negative for chills and fever.  HENT:  Negative for congestion and  sore throat.   Eyes:  Negative for pain and discharge.  Respiratory:  Negative for cough and shortness of breath.   Cardiovascular:  Negative for chest pain and palpitations.  Gastrointestinal:  Negative for diarrhea, nausea and vomiting.  Endocrine: Negative for polydipsia and polyuria.  Genitourinary:  Negative for dysuria and hematuria.  Musculoskeletal:  Negative for neck pain and neck stiffness.  Skin:  Negative for rash.  Neurological:  Positive for tremors. Negative for dizziness, weakness and numbness.  Psychiatric/Behavioral:  Negative for agitation and behavioral problems.       Objective:    Physical Exam Vitals reviewed.  Constitutional:      General: He is not in acute distress.    Appearance: He is not diaphoretic.  HENT:     Head: Normocephalic and atraumatic.     Nose: Nose normal.     Mouth/Throat:     Mouth: Mucous membranes are moist.  Eyes:     General: No scleral icterus.    Extraocular Movements: Extraocular movements intact.  Cardiovascular:     Rate and Rhythm: Normal rate and regular rhythm.     Pulses: Normal pulses.     Heart sounds: Normal heart sounds. No murmur heard. Pulmonary:     Breath sounds: Normal breath sounds. No wheezing or rales.  Musculoskeletal:     Cervical back: Neck supple. No tenderness.     Right lower leg: No edema.     Left lower leg: No edema.  Skin:    General: Skin is warm.     Findings: No erythema.  Neurological:     General: No focal deficit present.     Mental Status: He is alert and oriented to person, place, and time.     Sensory: No sensory deficit.     Motor: Tremor (B/l hands) present. No weakness.  Psychiatric:        Mood and Affect: Mood normal.        Behavior: Behavior normal.     BP (!) 144/90 (BP Location: Left Arm, Patient Position: Sitting, Cuff Size: Normal)   Pulse (!) 49   Ht _0  (1.702 m)   Wt 186 lb 6.4 oz (84.6 kg)   SpO2 98%   BMI 29.19 kg/m  Wt Readings from Last 3 Encounters:   04/18/22 186 lb 6.4 oz (84.6 kg)  10/11/21 181 lb 12.8 oz (82.5 kg)  09/06/21 174 lb (78.9 kg)    Lab Results  Component Value Date   TSH 0.966 10/11/2021   Lab Results  Component Value Date   WBC 5.9 10/11/2021   HGB 14.3 10/11/2021   HCT 42.9 10/11/2021   MCV 94 10/11/2021   PLT 262 10/11/2021   Lab Results  Component Value Date   NA 140 10/11/2021   K 5.2  10/11/2021   CO2 24 10/11/2021   GLUCOSE 95 10/11/2021   BUN 13 10/11/2021   CREATININE 1.08 10/11/2021   BILITOT 0.4 10/11/2021   ALKPHOS 66 10/11/2021   AST 18 10/11/2021   ALT 14 10/11/2021   PROT 7.0 10/11/2021   ALBUMIN 4.6 10/11/2021   CALCIUM 10.0 10/11/2021   EGFR 78 10/11/2021   Lab Results  Component Value Date   CHOL 199 10/11/2021   Lab Results  Component Value Date   HDL 67 10/11/2021   Lab Results  Component Value Date   LDLCALC 118 (H) 10/11/2021   Lab Results  Component Value Date   TRIG 76 10/11/2021   Lab Results  Component Value Date   CHOLHDL 3.0 10/11/2021   Lab Results  Component Value Date   HGBA1C 5.0 10/11/2021      Assessment & Plan:   Problem List Items Addressed This Visit       Cardiovascular and Mediastinum   Essential hypertension - Primary    BP Readings from Last 1 Encounters:  04/18/22 (!) 144/90  Uncontrolled with lisinopril 20 mg QD and Propranolol 20 mg QD (for tremors) Increased dose of lisinopril to 30 mg QD Counseled for compliance with the medications Advised DASH diet and moderate exercise/walking, at least 150 mins/week      Relevant Medications   lisinopril (ZESTRIL) 30 MG tablet   Other Relevant Orders   Basic Metabolic Panel (BMET)     Digestive   Barrett's esophagus    S/p Nissen procedure in 2007 On Nexium Followed by GI        Nervous and Auditory   Essential tremor    Has cut down alcohol use Sleep hygiene is discussed Had started propranolol 20 mg BID to help with anxiety and slightly elevated BP, but has been taking it  QD      Other Visit Diagnoses     Need for zoster vaccination       Relevant Orders   Varicella-zoster vaccine IM       Meds ordered this encounter  Medications   lisinopril (ZESTRIL) 30 MG tablet    Sig: Take 1 tablet (30 mg total) by mouth daily.    Dispense:  90 tablet    Refill:  1    Follow-up: Return in about 6 weeks (around 05/30/2022) for HTN.    Lindell Spar, MD

## 2022-04-21 ENCOUNTER — Encounter (INDEPENDENT_AMBULATORY_CARE_PROVIDER_SITE_OTHER): Payer: Self-pay | Admitting: Gastroenterology

## 2022-06-03 ENCOUNTER — Ambulatory Visit: Payer: BC Managed Care – PPO | Admitting: Internal Medicine

## 2022-07-01 ENCOUNTER — Encounter (INDEPENDENT_AMBULATORY_CARE_PROVIDER_SITE_OTHER): Payer: Self-pay | Admitting: Gastroenterology

## 2022-07-20 ENCOUNTER — Other Ambulatory Visit: Payer: Self-pay | Admitting: Internal Medicine

## 2022-07-20 DIAGNOSIS — G250 Essential tremor: Secondary | ICD-10-CM

## 2022-07-24 ENCOUNTER — Ambulatory Visit (INDEPENDENT_AMBULATORY_CARE_PROVIDER_SITE_OTHER): Payer: BC Managed Care – PPO | Admitting: Gastroenterology

## 2022-08-07 ENCOUNTER — Encounter: Payer: Self-pay | Admitting: Radiology

## 2022-09-12 DIAGNOSIS — I10 Essential (primary) hypertension: Secondary | ICD-10-CM | POA: Diagnosis not present

## 2022-09-13 LAB — BASIC METABOLIC PANEL
BUN/Creatinine Ratio: 11 (ref 10–24)
BUN: 15 mg/dL (ref 8–27)
CO2: 18 mmol/L — ABNORMAL LOW (ref 20–29)
Calcium: 9.6 mg/dL (ref 8.6–10.2)
Chloride: 103 mmol/L (ref 96–106)
Creatinine, Ser: 1.32 mg/dL — ABNORMAL HIGH (ref 0.76–1.27)
Glucose: 129 mg/dL — ABNORMAL HIGH (ref 70–99)
Potassium: 4.7 mmol/L (ref 3.5–5.2)
Sodium: 140 mmol/L (ref 134–144)
eGFR: 61 mL/min/{1.73_m2} (ref >59)

## 2022-09-18 ENCOUNTER — Other Ambulatory Visit: Payer: Self-pay | Admitting: Internal Medicine

## 2022-09-18 DIAGNOSIS — G250 Essential tremor: Secondary | ICD-10-CM

## 2022-09-29 ENCOUNTER — Encounter (INDEPENDENT_AMBULATORY_CARE_PROVIDER_SITE_OTHER): Payer: Self-pay | Admitting: Gastroenterology

## 2022-09-29 ENCOUNTER — Encounter: Payer: Self-pay | Admitting: Internal Medicine

## 2022-09-29 ENCOUNTER — Ambulatory Visit: Payer: BC Managed Care – PPO | Admitting: Internal Medicine

## 2022-09-29 ENCOUNTER — Ambulatory Visit (INDEPENDENT_AMBULATORY_CARE_PROVIDER_SITE_OTHER): Payer: BC Managed Care – PPO | Admitting: Gastroenterology

## 2022-09-29 VITALS — BP 132/80 | HR 57 | Temp 97.8°F | Ht 67.0 in | Wt 191.9 lb

## 2022-09-29 VITALS — BP 160/88 | HR 55 | Ht 67.0 in | Wt 192.8 lb

## 2022-09-29 DIAGNOSIS — K2270 Barrett's esophagus without dysplasia: Secondary | ICD-10-CM

## 2022-09-29 DIAGNOSIS — G250 Essential tremor: Secondary | ICD-10-CM | POA: Diagnosis not present

## 2022-09-29 DIAGNOSIS — D869 Sarcoidosis, unspecified: Secondary | ICD-10-CM | POA: Diagnosis not present

## 2022-09-29 DIAGNOSIS — I10 Essential (primary) hypertension: Secondary | ICD-10-CM | POA: Diagnosis not present

## 2022-09-29 DIAGNOSIS — K219 Gastro-esophageal reflux disease without esophagitis: Secondary | ICD-10-CM

## 2022-09-29 MED ORDER — AMLODIPINE-OLMESARTAN 5-40 MG PO TABS: 1.0000 | ORAL_TABLET | Freq: Every day | ORAL | 2 refills | Status: DC

## 2022-09-29 NOTE — Patient Instructions (Signed)
Continue Nexium 40 mg in AM Repeat EGD in 08/2024 and colonoscopy in 08/2028

## 2022-09-29 NOTE — Patient Instructions (Signed)
Please start taking Olmesartan-Amlodipine 40-5 mg once daily.  Please follow DASH diet and perform moderate exercise/walking at least 150 mins/week.

## 2022-09-29 NOTE — Progress Notes (Signed)
Established Patient Office Visit  Subjective:  Patient ID: Thomas Jordan, male    DOB: 07-25-1960  Age: 62 y.o. MRN: [id number]  CC:  Chief Complaint  Patient presents with   Hypertension    Follow up    HPI [name] is a 62 y.o. male with past medical history of HTN, Barett's esophagus and sarcoidosis who presents for f/u of his chronic medical conditions.  HTN: BP is elevated today.  He reports that he has been taking propranolol only once daily as he was having agitation while taking lisinopril and propranolol twice daily.  He reports that his BP was WNL at home, but he had been applying the cuff on the wrong arm.  He denies any headache, dizziness, chest pain, dyspnea or palpitations.  Tremors: Overall controlled with propranolol, could not continue twice daily dosing.  GERD: Well-controlled with Nexium.  Denies any nausea, vomiting, dysphagia or odynophagia.   Past Medical History:  Diagnosis Date   Barrett esophagus    GERD (gastroesophageal reflux disease)    Improved since Nissen 2007   Sarcoidosis of lung    Diagnosed on CT but has never had issues with it   Ulcer, stomach peptic    Numerous esophageal ulcers prior to Nissen    Past Surgical History:  Procedure Laterality Date   BIOPSY  09/06/2021   Procedure: BIOPSY;  Surgeon: Dolores Frame, MD;  [facility];  Service: Gastroenterology;;   COLONOSCOPY WITH PROPOFOL N/A 09/06/2021   Procedure: COLONOSCOPY WITH PROPOFOL;  Surgeon: Dolores Frame, MD;  [facility];  Service: Gastroenterology;  Laterality: N/A;  815, pt know to arrive at 6:30   ESOPHAGOGASTRODUODENOSCOPY (EGD) WITH PROPOFOL N/A 09/06/2021   Procedure: ESOPHAGOGASTRODUODENOSCOPY (EGD) WITH PROPOFOL;  Surgeon: Dolores Frame, MD;  [facility];  Service: Gastroenterology;  Laterality: N/A;   LAPAROSCOPIC NISSEN FUNDOPLICATION     POLYPECTOMY  09/06/2021   Procedure: POLYPECTOMY;   Surgeon: Dolores Frame, MD;  [facility];  Service: Gastroenterology;;   VASECTOMY     VASECTOMY REVERSAL      Family History  Problem Relation Age of Onset   Heart disease Mother    Hypertension Mother    Heart disease Father     Social History   Socioeconomic History   Marital status: Married    Spouse name: Not on file   Number of children: Not on file   Years of education: Not on file   Highest education level: Not on file  Occupational History   Not on file  Tobacco Use   Smoking status: Never   Smokeless tobacco: Current    Types: Snuff  Vaping Use   Vaping Use: Never used  Substance and Sexual Activity   Alcohol use: Yes    Alcohol/week: 3.0 standard drinks of alcohol    Types: 3 drink(s) per week   Drug use: No   Sexual activity: Not on file  Other Topics Concern   Not on file  Social History Narrative   Not on file   Social Determinants of Health   Financial Resource Strain: Not on file  Food Insecurity: Not on file  Transportation Needs: Not on file  Physical Activity: Not on file  Stress: Not on file  Social Connections: Not on file  Intimate Partner Violence: Not on file    Outpatient Medications Prior to Visit  Medication Sig Dispense Refill   acetaminophen (TYLENOL) 500 MG tablet Take 1,000 mg  by mouth every 6 (six) hours as needed for moderate pain.     esomeprazole (NEXIUM) 40 MG capsule TAKE 1 CAPSULE(40 MG) BY MOUTH DAILY 90 capsule 1   propranolol (INDERAL) 20 MG tablet TAKE 1 TABLET(20 MG) BY MOUTH TWICE DAILY 180 tablet 0   lisinopril (ZESTRIL) 30 MG tablet Take 1 tablet (30 mg total) by mouth daily. 90 tablet 1   No facility-administered medications prior to visit.    Allergies  Allergen Reactions   Penicillins Rash    ROS Review of Systems  Constitutional:  Negative for chills and fever.  HENT:  Negative for congestion and sore throat.   Eyes:  Negative for pain and discharge.  Respiratory:  Negative  for cough and shortness of breath.   Cardiovascular:  Negative for chest pain and palpitations.  Gastrointestinal:  Negative for diarrhea, nausea and vomiting.  Endocrine: Negative for polydipsia and polyuria.  Genitourinary:  Negative for dysuria and hematuria.  Musculoskeletal:  Negative for neck pain and neck stiffness.  Skin:  Negative for rash.  Neurological:  Positive for tremors. Negative for dizziness, weakness and numbness.  Psychiatric/Behavioral:  Negative for agitation and behavioral problems.       Objective:    Physical Exam Vitals reviewed.  Constitutional:      General: He is not in acute distress.    Appearance: He is not diaphoretic.  HENT:     Head: Normocephalic and atraumatic.     Nose: Nose normal.     Mouth/Throat:     Mouth: Mucous membranes are moist.  Eyes:     General: No scleral icterus.    Extraocular Movements: Extraocular movements intact.  Cardiovascular:     Rate and Rhythm: Normal rate and regular rhythm.     Pulses: Normal pulses.     Heart sounds: Normal heart sounds. No murmur heard. Pulmonary:     Breath sounds: Normal breath sounds. No wheezing or rales.  Musculoskeletal:     Cervical back: Neck supple. No tenderness.     Right lower leg: No edema.     Left lower leg: No edema.  Skin:    General: Skin is warm.     Findings: No erythema.  Neurological:     General: No focal deficit present.     Mental Status: He is alert and oriented to person, place, and time.     Sensory: No sensory deficit.     Motor: Tremor (B/l hands) present. No weakness.  Psychiatric:        Mood and Affect: Mood normal.        Behavior: Behavior normal.     BP (!) 160/88 (BP Location: Left Arm)   Pulse (!) 55   Ht 5\' 7"  (1.702 m)   Wt 192 lb 12.8 oz (87.5 kg)   SpO2 98%   BMI 30.20 kg/m  Wt Readings from Last 3 Encounters:  09/29/22 191 lb 14.4 oz (87 kg)  09/29/22 192 lb 12.8 oz (87.5 kg)  04/18/22 186 lb 6.4 oz (84.6 kg)    Lab Results   Component Value Date   TSH 0.966 10/11/2021   Lab Results  Component Value Date   WBC 5.9 10/11/2021   HGB 14.3 10/11/2021   HCT 42.9 10/11/2021   MCV 94 10/11/2021   PLT 262 10/11/2021   Lab Results  Component Value Date   NA 140 09/12/2022   K 4.7 09/12/2022   CO2 18 (L) 09/12/2022   GLUCOSE 129 (H) 09/12/2022  BUN 15 09/12/2022   CREATININE 1.32 (H) 09/12/2022   BILITOT 0.4 10/11/2021   ALKPHOS 66 10/11/2021   AST 18 10/11/2021   ALT 14 10/11/2021   PROT 7.0 10/11/2021   ALBUMIN 4.6 10/11/2021   CALCIUM 9.6 09/12/2022   EGFR 61 09/12/2022   Lab Results  Component Value Date   CHOL 199 10/11/2021   Lab Results  Component Value Date   HDL 67 10/11/2021   Lab Results  Component Value Date   LDLCALC 118 (H) 10/11/2021   Lab Results  Component Value Date   TRIG 76 10/11/2021   Lab Results  Component Value Date   CHOLHDL 3.0 10/11/2021   Lab Results  Component Value Date   HGBA1C 5.0 10/11/2021      Assessment & Plan:   Problem List Items Addressed This Visit       Cardiovascular and Mediastinum   Essential hypertension - Primary    BP: 160/88  Uncontrolled with lisinopril 30 mg QD and Propranolol 20 mg QD (for tremors) Switched from lisinopril to amlodipine-olmesartan 5-40 mg QD Counseled for compliance with the medications Advised DASH diet and moderate exercise/walking, at least 150 mins/week      Relevant Medications   amLODipine-olmesartan (AZOR) 5-40 MG tablet     Digestive   Barrett's esophagus    S/p Nissen procedure in 2007 On Nexium Followed by GI        Nervous and Auditory   Essential tremor    Has cut down alcohol use Sleep hygiene is discussed Had started propranolol 20 mg BID to help with anxiety and slightly elevated BP, but has been taking it QD        Other   Sarcoidosis    Asymptomatic currently      Meds ordered this encounter  Medications   amLODipine-olmesartan (AZOR) 5-40 MG tablet    Sig: Take 1  tablet by mouth daily.    Dispense:  30 tablet    Refill:  2    Follow-up: Return in about 2 months (around 11/29/2022) for HTN.    Anabel Halon, MD

## 2022-09-29 NOTE — Progress Notes (Signed)
Katrinka Blazing, M.D. Gastroenterology & Hepatology [facility] [address] [place], [place] [address]  Primary Care Physician: Anabel Halon, MD [address] [place] [address]  I will communicate my assessment and recommendations to the referring MD via EMR.  Problems: GERD Long segment Barrett's esophagus  History of Present Illness: Thomas Jordan is a 61y.o. male with PMH GERD status post Nissen fundoplication patient complicated by Barrett's esophagus, sarcoidosis, who presents for follow up of GERD and Barrett's esophagus.   The patient was last seen on 07/22/2021. At that time, the patient was scheduled to undergo EGD and colonoscopy with findings described below.  He was continued on Nexium.  He was advised to stop chewing tobacco and to decrease beer intake.  Patient denies any heartburn, odynophagia, or dysphagia. Takes Nexium 40 mg every day, usually in AM and may wait 2 hours to have breakfast.  The patient denies having any nausea, vomiting, fever, chills, hematochezia, melena, hematemesis, abdominal distention, abdominal pain, diarrhea, jaundice, pruritus or weight loss.  Last EGD: 09/06/2021. Changes consistent with long segment Barrett's esophagus (5 cm), biopsies every 1 cm were negative for dysplasia, normal stomach and duodenum Recommended with repeat endoscopy in 3 years  Last Colonoscopy: 09/06/2021 2 polyps removed from the descending and ascending colon (tubular adenomas) Repeat colonoscopy in 7 years.  Past Medical History: Past Medical History:  Diagnosis Date   Barrett esophagus    GERD (gastroesophageal reflux disease)    Improved since Nissen 2007   Sarcoidosis of lung    Diagnosed on CT but has never had issues with it   Ulcer, stomach peptic    Numerous esophageal ulcers prior to Nissen    Past Surgical History: Past Surgical History:  Procedure Laterality Date   BIOPSY  09/06/2021    Procedure: BIOPSY;  Surgeon: Dolores Frame, MD;  [facility];  Service: Gastroenterology;;   COLONOSCOPY WITH PROPOFOL N/A 09/06/2021   Procedure: COLONOSCOPY WITH PROPOFOL;  Surgeon: Dolores Frame, MD;  [facility];  Service: Gastroenterology;  Laterality: N/A;  815, pt know to arrive at 6:30   ESOPHAGOGASTRODUODENOSCOPY (EGD) WITH PROPOFOL N/A 09/06/2021   Procedure: ESOPHAGOGASTRODUODENOSCOPY (EGD) WITH PROPOFOL;  Surgeon: Dolores Frame, MD;  [facility];  Service: Gastroenterology;  Laterality: N/A;   LAPAROSCOPIC NISSEN FUNDOPLICATION     POLYPECTOMY  09/06/2021   Procedure: POLYPECTOMY;  Surgeon: Dolores Frame, MD;  [facility];  Service: Gastroenterology;;   VASECTOMY     VASECTOMY REVERSAL      Family History: Family History  Problem Relation Age of Onset   Heart disease Mother    Hypertension Mother    Heart disease Father     Social History: Social History   Tobacco Use  Smoking Status Never  Smokeless Tobacco Current   Types: Snuff   Social History   Substance and Sexual Activity  Alcohol Use Yes   Alcohol/week: 3.0 standard drinks of alcohol   Types: 3 drink(s) per week   Social History   Substance and Sexual Activity  Drug Use No    Allergies: Allergies  Allergen Reactions   Penicillins Rash    Medications: Current Outpatient Medications  Medication Sig Dispense Refill   acetaminophen (TYLENOL) 500 MG tablet Take 1,000 mg by mouth every 6 (six) hours as needed for moderate pain.     amLODipine-olmesartan (AZOR) 5-40 MG tablet Take 1 tablet by mouth daily. 30 tablet 2  esomeprazole (NEXIUM) 40 MG capsule TAKE 1 CAPSULE(40 MG) BY MOUTH DAILY 90 capsule 1   propranolol (INDERAL) 20 MG tablet TAKE 1 TABLET(20 MG) BY MOUTH TWICE DAILY 180 tablet 0   No current facility-administered medications for this visit.    Review of Systems: GENERAL: negative for  malaise, night sweats HEENT: No changes in hearing or vision, no nose bleeds or other nasal problems. NECK: Negative for lumps, goiter, pain and significant neck swelling RESPIRATORY: Negative for cough, wheezing CARDIOVASCULAR: Negative for chest pain, leg swelling, palpitations, orthopnea GI: SEE HPI MUSCULOSKELETAL: Negative for joint pain or swelling, back pain, and muscle pain. SKIN: Negative for lesions, rash PSYCH: Negative for sleep disturbance, mood disorder and recent psychosocial stressors. HEMATOLOGY Negative for prolonged bleeding, bruising easily, and swollen nodes. ENDOCRINE: Negative for cold or heat intolerance, polyuria, polydipsia and goiter. NEURO: negative for tremor, gait imbalance, syncope and seizures. The remainder of the review of systems is noncontributory.   Physical Exam: BP 132/80 (BP Location: Left Arm, Patient Position: Sitting, Cuff Size: Large)   Pulse (!) 57   Temp 97.8 F (36.6 C) (Temporal)   Ht  (1.702 m)   Wt 191 lb 14.4 oz (87 kg)   BMI 30.06 kg/m  GENERAL: The patient is AO x3, in no acute distress. HEENT: Head is normocephalic and atraumatic. EOMI are intact. Mouth is well hydrated and without lesions. NECK: Supple. No masses LUNGS: Clear to auscultation. No presence of rhonchi/wheezing/rales. Adequate chest expansion HEART: RRR, normal s1 and s2. ABDOMEN: Soft, nontender, no guarding, no peritoneal signs, and nondistended. BS +. No masses. EXTREMITIES: Without any cyanosis, clubbing, rash, lesions or edema. NEUROLOGIC: AOx3, no focal motor deficit. SKIN: no jaundice, no rashes  Imaging/Labs: as above  I personally reviewed and interpreted the available labs, imaging and endoscopic files.  Impression and Plan: Thomas Jordan is a 61y.o. male with PMH GERD status post Nissen fundoplication patient complicated by Barrett's esophagus, sarcoidosis, who presents for follow up of GERD and Barrett's esophagus.  Patient has presented  adequate control of his GERD while taking PPI compliantly every day.  Denies any other associated symptoms.  Will continue with the same dose at the moment.  He did not have any dysplasia in his most recent EGD, next EGD should be performed in 3 years.  I advised the patient to decrease the intake of alcohol and tobacco chewing, he agreed understood.  - Continue Nexium 40 mg in AM - Repeat EGD in 08/2024 and colonoscopy in 08/2028  All questions were answered.      Katrinka Blazing, MD Gastroenterology and Hepatology [facility] Gastroenterology

## 2022-10-01 NOTE — Assessment & Plan Note (Signed)
Asymptomatic currently 

## 2022-10-01 NOTE — Assessment & Plan Note (Signed)
S/p Nissen procedure in 2007 ?On Nexium ?Followed by GI ?

## 2022-10-01 NOTE — Assessment & Plan Note (Signed)
BP: 160/88  Uncontrolled with lisinopril 30 mg QD and Propranolol 20 mg QD (for tremors) Switched from lisinopril to amlodipine-olmesartan 5-40 mg QD Counseled for compliance with the medications Advised DASH diet and moderate exercise/walking, at least 150 mins/week

## 2022-10-01 NOTE — Assessment & Plan Note (Signed)
Has cut down alcohol use Sleep hygiene is discussed Had started propranolol 20 mg BID to help with anxiety and slightly elevated BP, but has been taking it QD 

## 2022-11-05 ENCOUNTER — Other Ambulatory Visit: Payer: Self-pay | Admitting: Internal Medicine

## 2022-11-05 DIAGNOSIS — K22719 Barrett's esophagus with dysplasia, unspecified: Secondary | ICD-10-CM

## 2022-12-05 ENCOUNTER — Ambulatory Visit: Payer: BC Managed Care – PPO | Admitting: Internal Medicine

## 2022-12-19 ENCOUNTER — Ambulatory Visit: Payer: BC Managed Care – PPO | Admitting: Internal Medicine

## 2022-12-19 ENCOUNTER — Encounter: Payer: Self-pay | Admitting: Internal Medicine

## 2022-12-19 VITALS — BP 148/82 | HR 51 | Ht 67.0 in | Wt 189.4 lb

## 2022-12-19 DIAGNOSIS — S46812A Strain of other muscles, fascia and tendons at shoulder and upper arm level, left arm, initial encounter: Secondary | ICD-10-CM

## 2022-12-19 DIAGNOSIS — I10 Essential (primary) hypertension: Secondary | ICD-10-CM

## 2022-12-19 DIAGNOSIS — Z125 Encounter for screening for malignant neoplasm of prostate: Secondary | ICD-10-CM

## 2022-12-19 DIAGNOSIS — E559 Vitamin D deficiency, unspecified: Secondary | ICD-10-CM

## 2022-12-19 DIAGNOSIS — R739 Hyperglycemia, unspecified: Secondary | ICD-10-CM

## 2022-12-19 DIAGNOSIS — G250 Essential tremor: Secondary | ICD-10-CM

## 2022-12-19 DIAGNOSIS — E782 Mixed hyperlipidemia: Secondary | ICD-10-CM

## 2022-12-19 MED ORDER — CYCLOBENZAPRINE HCL 5 MG PO TABS: 5.0000 mg | ORAL_TABLET | Freq: Two times a day (BID) | ORAL | 1 refills | Status: AC | PRN

## 2022-12-19 MED ORDER — AMLODIPINE-OLMESARTAN 10-40 MG PO TABS: 1.0000 | ORAL_TABLET | Freq: Every day | ORAL | 3 refills | Status: DC

## 2022-12-19 NOTE — Progress Notes (Signed)
Established Patient Office Visit  Subjective:  Patient ID: Thomas Jordan Welcome, male    DOB: 10-29-1960  Age: 62 y.o. MRN: [id number]  CC:  Chief Complaint  Patient presents with   Hypertension    Follow up    HPI Thomas Jordan is a 62 y.o. male with past medical history of HTN, Barett's esophagus and sarcoidosis who presents for f/u of his chronic medical conditions.  HTN: BP is elevated today, but better compared to prior.  He has started taking Azor at 5-40 mg QD.  He reports that he has been taking propranolol only once daily as he was having agitation while taking propranolol twice daily. He denies any headache, dizziness, chest pain, dyspnea or palpitations.  Tremors: Overall controlled with propranolol, could not continue twice daily dosing.  GERD: Well-controlled with Nexium.  Denies any nausea, vomiting, dysphagia or odynophagia.  He reports left-sided neck area and shoulder area pain for the last 2 weeks.  He recently went on a camping vacation and has been having constant neck area pain since then.  He has tried local massage therapy at his workplace and at home with mild relief.  He still has constant pain, which affects his sleep.  Denies any numbness or tingling of the UE.   Past Medical History:  Diagnosis Date   Barrett esophagus    GERD (gastroesophageal reflux disease)    Improved since Nissen 2007   Sarcoidosis of lung (HCC)    Diagnosed on CT but has never had issues with it   Ulcer, stomach peptic    Numerous esophageal ulcers prior to Nissen    Past Surgical History:  Procedure Laterality Date   BIOPSY  09/06/2021   Procedure: BIOPSY;  Surgeon: Dolores Frame, MD;  Location: [facility];  Service: Gastroenterology;;   COLONOSCOPY WITH PROPOFOL N/A 09/06/2021   Procedure: COLONOSCOPY WITH PROPOFOL;  Surgeon: Dolores Frame, MD;  Location: [facility];  Service: Gastroenterology;  Laterality: N/A;  815, pt know to arrive at 6:30    ESOPHAGOGASTRODUODENOSCOPY (EGD) WITH PROPOFOL N/A 09/06/2021   Procedure: ESOPHAGOGASTRODUODENOSCOPY (EGD) WITH PROPOFOL;  Surgeon: Dolores Frame, MD;  Location: [facility];  Service: Gastroenterology;  Laterality: N/A;   LAPAROSCOPIC NISSEN FUNDOPLICATION     POLYPECTOMY  09/06/2021   Procedure: POLYPECTOMY;  Surgeon: Dolores Frame, MD;  Location: [facility];  Service: Gastroenterology;;   VASECTOMY     VASECTOMY REVERSAL      Family History  Problem Relation Age of Onset   Heart disease Mother    Hypertension Mother    Heart disease Father     Social History   Socioeconomic History   Marital status: Married    Spouse name: Not on file   Number of children: Not on file   Years of education: Not on file   Highest education level: Not on file  Occupational History   Not on file  Tobacco Use   Smoking status: Never   Smokeless tobacco: Current    Types: Snuff  Vaping Use   Vaping status: Never Used  Substance and Sexual Activity   Alcohol use: Yes    Alcohol/week: 3.0 standard drinks of alcohol    Types: 3 drink(s) per week   Drug use: No   Sexual activity: Not on file  Other Topics Concern   Not on file  Social History Narrative   Not on file   Social Determinants of Health   Financial Resource Strain: Not on  file  Food Insecurity: Not on file  Transportation Needs: Not on file  Physical Activity: Not on file  Stress: Not on file  Social Connections: Not on file  Intimate Partner Violence: Not on file    Outpatient Medications Prior to Visit  Medication Sig Dispense Refill   acetaminophen (TYLENOL) 500 MG tablet Take 1,000 mg by mouth every 6 (six) hours as needed for moderate pain.     esomeprazole (NEXIUM) 40 MG capsule TAKE 1 CAPSULE(40 MG) BY MOUTH DAILY 90 capsule 1   propranolol (INDERAL) 20 MG tablet TAKE 1 TABLET(20 MG) BY MOUTH TWICE DAILY 180 tablet 0   amLODipine-olmesartan (AZOR) 5-40 MG tablet Take 1 tablet by mouth  daily. 30 tablet 2   No facility-administered medications prior to visit.    Allergies  Allergen Reactions   Penicillins Rash    ROS Review of Systems  Constitutional:  Negative for chills and fever.  HENT:  Negative for congestion and sore throat.   Eyes:  Negative for pain and discharge.  Respiratory:  Negative for cough and shortness of breath.   Cardiovascular:  Negative for chest pain and palpitations.  Gastrointestinal:  Negative for diarrhea, nausea and vomiting.  Endocrine: Negative for polydipsia and polyuria.  Genitourinary:  Negative for dysuria and hematuria.  Musculoskeletal:  Positive for neck pain. Negative for neck stiffness.  Skin:  Negative for rash.  Neurological:  Positive for tremors. Negative for dizziness, weakness and numbness.  Psychiatric/Behavioral:  Negative for agitation and behavioral problems.       Objective:    Physical Exam Vitals reviewed.  Constitutional:      General: He is not in acute distress.    Appearance: He is not diaphoretic.  HENT:     Head: Normocephalic and atraumatic.     Nose: Nose normal.     Mouth/Throat:     Mouth: Mucous membranes are moist.  Eyes:     General: No scleral icterus.    Extraocular Movements: Extraocular movements intact.  Cardiovascular:     Rate and Rhythm: Normal rate and regular rhythm.     Pulses: Normal pulses.     Heart sounds: Normal heart sounds. No murmur heard. Pulmonary:     Breath sounds: Normal breath sounds. No wheezing or rales.  Musculoskeletal:     Cervical back: Neck supple. Tenderness (mild) present.     Right lower leg: No edema.     Left lower leg: No edema.  Skin:    General: Skin is warm.     Findings: No erythema.  Neurological:     General: No focal deficit present.     Mental Status: He is alert and oriented to person, place, and time.     Sensory: No sensory deficit.     Motor: Tremor (B/l hands) present. No weakness.  Psychiatric:        Mood and Affect: Mood  normal.        Behavior: Behavior normal.     BP (!) 148/82 (BP Location: Left Arm)   Pulse (!) 51   Ht 5\' 7"  (1.702 m)   Wt 189 lb 6.4 oz (85.9 kg)   SpO2 97%   BMI 29.66 kg/m  Wt Readings from Last 3 Encounters:  12/19/22 189 lb 6.4 oz (85.9 kg)  09/29/22 191 lb 14.4 oz (87 kg)  09/29/22 192 lb 12.8 oz (87.5 kg)    Lab Results  Component Value Date   TSH 0.966 10/11/2021   Lab Results  Component Value Date   WBC 5.9 10/11/2021   HGB 14.3 10/11/2021   HCT 42.9 10/11/2021   MCV 94 10/11/2021   PLT 262 10/11/2021   Lab Results  Component Value Date   NA 140 09/12/2022   K 4.7 09/12/2022   CO2 18 (L) 09/12/2022   GLUCOSE 129 (H) 09/12/2022   BUN 15 09/12/2022   CREATININE 1.32 (H) 09/12/2022   BILITOT 0.4 10/11/2021   ALKPHOS 66 10/11/2021   AST 18 10/11/2021   ALT 14 10/11/2021   PROT 7.0 10/11/2021   ALBUMIN 4.6 10/11/2021   CALCIUM 9.6 09/12/2022   EGFR 61 09/12/2022   Lab Results  Component Value Date   CHOL 199 10/11/2021   Lab Results  Component Value Date   HDL 67 10/11/2021   Lab Results  Component Value Date   LDLCALC 118 (H) 10/11/2021   Lab Results  Component Value Date   TRIG 76 10/11/2021   Lab Results  Component Value Date   CHOLHDL 3.0 10/11/2021   Lab Results  Component Value Date   HGBA1C 5.0 10/11/2021      Assessment & Plan:   Problem List Items Addressed This Visit       Cardiovascular and Mediastinum   Essential hypertension - Primary    BP: 160/88  Uncontrolled with amlodipine-olmesartan 5-40 mg every day Increased dose of  amlodipine-olmesartan to 10-40 mg every day Continue propranolol for tremors Counseled for compliance with the medications Advised DASH diet and moderate exercise/walking, at least 150 mins/week      Relevant Medications   amLODipine-olmesartan (AZOR) 10-40 MG tablet   Other Relevant Orders   TSH   CMP14+EGFR   CBC with Differential/Platelet     Nervous and Auditory   Essential  tremor    Has cut down alcohol use Sleep hygiene is discussed Had started propranolol 20 mg BID to help with anxiety and slightly elevated BP, but has been taking it QD      Relevant Orders   TSH   CMP14+EGFR     Musculoskeletal and Integument   Strain of left trapezius muscle    Left-sided neck and shoulder area pain likely due to trapezius strain from sleeping posture Continue local massage therapy Flexeril as needed for muscle spasms Can take Tylenol or ibuprofen as needed for pain      Relevant Medications   cyclobenzaprine (FLEXERIL) 5 MG tablet     Other   Prostate cancer screening    Ordered PSA after discussing its limitations for prostate cancer screening, including false positive results leading to additional investigations.       Relevant Orders   PSA   Mixed hyperlipidemia    Check lipid profile Advised to follow DASH diet      Relevant Medications   amLODipine-olmesartan (AZOR) 10-40 MG tablet   Other Relevant Orders   Lipid panel   Other Visit Diagnoses     Vitamin D deficiency       Relevant Orders   VITAMIN D 25 Hydroxy (Vit-D Deficiency, Fractures)   Hyperglycemia       Relevant Orders   Hemoglobin A1c       Meds ordered this encounter  Medications   cyclobenzaprine (FLEXERIL) 5 MG tablet    Sig: Take 1 tablet (5 mg total) by mouth 2 (two) times daily as needed for muscle spasms.    Dispense:  30 tablet    Refill:  1   amLODipine-olmesartan (AZOR) 10-40 MG tablet  Sig: Take 1 tablet by mouth daily.    Dispense:  30 tablet    Refill:  3    Follow-up: Return in about 3 months (around 03/21/2023) for Annual physical.    Anabel Halon, MD

## 2022-12-19 NOTE — Assessment & Plan Note (Signed)
Check lipid profile Advised to follow DASH diet 

## 2022-12-19 NOTE — Assessment & Plan Note (Signed)
BP: 160/88  Uncontrolled with amlodipine-olmesartan 5-40 mg every day Increased dose of  amlodipine-olmesartan to 10-40 mg every day Continue propranolol for tremors Counseled for compliance with the medications Advised DASH diet and moderate exercise/walking, at least 150 mins/week

## 2022-12-19 NOTE — Assessment & Plan Note (Signed)
Ordered PSA after discussing its limitations for prostate cancer screening, including false positive results leading to additional investigations. 

## 2022-12-19 NOTE — Assessment & Plan Note (Signed)
Left-sided neck and shoulder area pain likely due to trapezius strain from sleeping posture Continue local massage therapy Flexeril as needed for muscle spasms Can take Tylenol or ibuprofen as needed for pain

## 2022-12-19 NOTE — Assessment & Plan Note (Signed)
Has cut down alcohol use Sleep hygiene is discussed Had started propranolol 20 mg BID to help with anxiety and slightly elevated BP, but has been taking it QD 

## 2022-12-19 NOTE — Patient Instructions (Signed)
Please start taking Amlodipine-Olmesartan 10-40 mg once daily instead of 5-40 mg.  Please continue to take medications as prescribed.  Please continue to follow low salt diet and perform moderate exercise/walking at least 150 mins/week.  Please get fasting blood tests done before the next visit.

## 2022-12-29 ENCOUNTER — Other Ambulatory Visit: Payer: Self-pay | Admitting: Internal Medicine

## 2022-12-29 DIAGNOSIS — I10 Essential (primary) hypertension: Secondary | ICD-10-CM

## 2023-03-25 ENCOUNTER — Other Ambulatory Visit: Payer: Self-pay | Admitting: Internal Medicine

## 2023-03-25 DIAGNOSIS — G250 Essential tremor: Secondary | ICD-10-CM

## 2023-03-27 ENCOUNTER — Encounter: Payer: BC Managed Care – PPO | Admitting: Internal Medicine

## 2023-03-30 DIAGNOSIS — R739 Hyperglycemia, unspecified: Secondary | ICD-10-CM | POA: Diagnosis not present

## 2023-03-30 DIAGNOSIS — Z125 Encounter for screening for malignant neoplasm of prostate: Secondary | ICD-10-CM | POA: Diagnosis not present

## 2023-03-30 DIAGNOSIS — E559 Vitamin D deficiency, unspecified: Secondary | ICD-10-CM | POA: Diagnosis not present

## 2023-03-30 DIAGNOSIS — G250 Essential tremor: Secondary | ICD-10-CM | POA: Diagnosis not present

## 2023-03-30 DIAGNOSIS — E782 Mixed hyperlipidemia: Secondary | ICD-10-CM | POA: Diagnosis not present

## 2023-03-31 LAB — CBC WITH DIFFERENTIAL/PLATELET
Basophils Absolute: 0.1 10*3/uL (ref 0.0–0.2)
Basos: 1 %
EOS (ABSOLUTE): 0.3 10*3/uL (ref 0.0–0.4)
Eos: 5 %
Hematocrit: 45.7 % (ref 37.5–51.0)
Hemoglobin: 15 g/dL (ref 13.0–17.7)
Immature Grans (Abs): 0 10*3/uL (ref 0.0–0.1)
Immature Granulocytes: 1 %
Lymphocytes Absolute: 1 10*3/uL (ref 0.7–3.1)
Lymphs: 17 %
MCH: 32.3 pg (ref 26.6–33.0)
MCHC: 32.8 g/dL (ref 31.5–35.7)
MCV: 98 fL — ABNORMAL HIGH (ref 79–97)
Monocytes Absolute: 0.5 10*3/uL (ref 0.1–0.9)
Monocytes: 9 %
Neutrophils Absolute: 3.8 10*3/uL (ref 1.4–7.0)
Neutrophils: 67 %
Platelets: 224 10*3/uL (ref 150–450)
RBC: 4.65 x10E6/uL (ref 4.14–5.80)
RDW: 12.6 % (ref 11.6–15.4)
WBC: 5.6 10*3/uL (ref 3.4–10.8)

## 2023-03-31 LAB — VITAMIN D 25 HYDROXY (VIT D DEFICIENCY, FRACTURES): Vit D, 25-Hydroxy: 51.6 ng/mL (ref 30.0–100.0)

## 2023-03-31 LAB — TSH: TSH: 1.37 u[IU]/mL (ref 0.450–4.500)

## 2023-03-31 LAB — LIPID PANEL
Chol/HDL Ratio: 3.1 {ratio} (ref 0.0–5.0)
Cholesterol, Total: 196 mg/dL (ref 100–199)
HDL: 63 mg/dL (ref >39)
LDL Chol Calc (NIH): 120 mg/dL — ABNORMAL HIGH (ref 0–99)
Triglycerides: 72 mg/dL (ref 0–149)
VLDL Cholesterol Cal: 13 mg/dL (ref 5–40)

## 2023-03-31 LAB — CMP14+EGFR
ALT: 14 [IU]/L (ref 0–44)
AST: 17 [IU]/L (ref 0–40)
Albumin: 4.3 g/dL (ref 3.9–4.9)
Alkaline Phosphatase: 73 [IU]/L (ref 44–121)
BUN/Creatinine Ratio: 9 — ABNORMAL LOW (ref 10–24)
BUN: 11 mg/dL (ref 8–27)
Bilirubin Total: 0.6 mg/dL (ref 0.0–1.2)
CO2: 23 mmol/L (ref 20–29)
Calcium: 9.9 mg/dL (ref 8.6–10.2)
Chloride: 102 mmol/L (ref 96–106)
Creatinine, Ser: 1.25 mg/dL (ref 0.76–1.27)
Globulin, Total: 2.6 g/dL (ref 1.5–4.5)
Glucose: 93 mg/dL (ref 70–99)
Potassium: 4.9 mmol/L (ref 3.5–5.2)
Sodium: 139 mmol/L (ref 134–144)
Total Protein: 6.9 g/dL (ref 6.0–8.5)
eGFR: 64 mL/min/{1.73_m2} (ref >59)

## 2023-03-31 LAB — HEMOGLOBIN A1C
Est. average glucose Bld gHb Est-mCnc: 111 mg/dL
Hgb A1c MFr Bld: 5.5 % (ref 4.8–5.6)

## 2023-03-31 LAB — PSA: Prostate Specific Ag, Serum: 0.5 ng/mL (ref 0.0–4.0)

## 2023-04-01 ENCOUNTER — Encounter: Payer: Self-pay | Admitting: Internal Medicine

## 2023-04-01 ENCOUNTER — Ambulatory Visit: Payer: BC Managed Care – PPO | Admitting: Internal Medicine

## 2023-04-01 VITALS — BP 129/79 | HR 78 | Ht 67.0 in | Wt 190.4 lb

## 2023-04-01 DIAGNOSIS — Z0001 Encounter for general adult medical examination with abnormal findings: Secondary | ICD-10-CM

## 2023-04-01 DIAGNOSIS — G250 Essential tremor: Secondary | ICD-10-CM

## 2023-04-01 DIAGNOSIS — H6121 Impacted cerumen, right ear: Secondary | ICD-10-CM | POA: Diagnosis not present

## 2023-04-01 DIAGNOSIS — K2270 Barrett's esophagus without dysplasia: Secondary | ICD-10-CM | POA: Diagnosis not present

## 2023-04-01 DIAGNOSIS — I10 Essential (primary) hypertension: Secondary | ICD-10-CM

## 2023-04-01 DIAGNOSIS — E782 Mixed hyperlipidemia: Secondary | ICD-10-CM

## 2023-04-01 DIAGNOSIS — M79671 Pain in right foot: Secondary | ICD-10-CM

## 2023-04-01 MED ORDER — AMLODIPINE-OLMESARTAN 10-40 MG PO TABS: 1.0000 | ORAL_TABLET | Freq: Every day | ORAL | 1 refills | Status: DC

## 2023-04-01 NOTE — Assessment & Plan Note (Addendum)
BP Readings from Last 1 Encounters:  04/01/23 129/79   Well-controlled with  amlodipine-olmesartan to 10-40 mg QD Continue propranolol for tremors Counseled for compliance with the medications Advised DASH diet and moderate exercise/walking, at least 150 mins/week

## 2023-04-01 NOTE — Assessment & Plan Note (Signed)
S/p Nissen procedure in 2007 On Nexium Followed by GI 

## 2023-04-01 NOTE — Assessment & Plan Note (Signed)
Right ear irrigation today, he tolerated procedure well Advised to use Debrox as needed for excess earwax

## 2023-04-01 NOTE — Progress Notes (Addendum)
Established Patient Office Visit  Subjective:  Patient ID: Thomas Jordan, male    DOB: 01-15-1961  Age: 62 y.o. MRN: [id number]  CC:  Chief Complaint  Patient presents with   Annual Exam   Ear Pain    Earache in right ear, feels like fluid    HPI Thomas Jordan is a 62 y.o. male with past medical history of HTN, Barett's esophagus and sarcoidosis who presents for annual physical.  HTN: BP is well-controlled now. Takes Azor 10-40 mg QD regularly. Patient denies headache, dizziness, chest pain, dyspnea or palpitations.  Barrett's esophagus: On Nexium.  Followed by GI. Recently had GI.  He has mild hand tremors upon activity, which is improved with Propranolol 20 mg QD.  He denies any resting tremor.  He has difficulty doing his routine work at times due to tremors. He used to take 2 beers per day, but has cut down.  He has 1 to 2 cups of coffee per day.  Denies any smoking.  He reports right ear fullness and fluidlike sensation for the last 1 week.  Denies any ear discharge.  Denies any fever or chills.  Denies nasal congestion or postnasal drip.  He has chronic tinnitus, denies dizziness.  He also c/o right foot pain, mainly underneath the 5th digit since 03/18/23. Pain is constant, worse upon walking and better with rest. He reports that he has not been wearing his protective shoes that he wears at workplace for the last 2 weeks as he has not been at work recently. He states that those shoes have better support.  Past Medical History:  Diagnosis Date   Barrett esophagus    GERD (gastroesophageal reflux disease)    Improved since Nissen 2007   Sarcoidosis of lung (HCC)    Diagnosed on CT but has never had issues with it   Ulcer, stomach peptic    Numerous esophageal ulcers prior to Nissen    Past Surgical History:  Procedure Laterality Date   BIOPSY  09/06/2021   Procedure: BIOPSY;  Surgeon: Dolores Frame, MD;  Location: [facility];  Service: Gastroenterology;;    COLONOSCOPY WITH PROPOFOL N/A 09/06/2021   Procedure: COLONOSCOPY WITH PROPOFOL;  Surgeon: Dolores Frame, MD;  Location: [facility];  Service: Gastroenterology;  Laterality: N/A;  815, pt know to arrive at 6:30   ESOPHAGOGASTRODUODENOSCOPY (EGD) WITH PROPOFOL N/A 09/06/2021   Procedure: ESOPHAGOGASTRODUODENOSCOPY (EGD) WITH PROPOFOL;  Surgeon: Dolores Frame, MD;  [facility]: [facility];  Service: Gastroenterology;  Laterality: N/A;   LAPAROSCOPIC NISSEN FUNDOPLICATION     POLYPECTOMY  09/06/2021   Procedure: POLYPECTOMY;  Surgeon: Dolores Frame, MD;  [facility]: [facility];  Service: Gastroenterology;;   VASECTOMY     VASECTOMY REVERSAL      Family History  Problem Relation Age of Onset   Heart disease Mother    Hypertension Mother    Heart disease Father     Social History   Socioeconomic History   Marital status: Married    Spouse name: Not on file   Number of children: Not on file   Years of education: Not on file   Highest education level: Not on file  Occupational History   Not on file  Tobacco Use   Smoking status: Never   Smokeless tobacco: Current    Types: Snuff  Vaping Use   Vaping status: Never Used  Substance and Sexual Activity   Alcohol use: Yes    Alcohol/week: 3.0 standard  drinks of alcohol    Types: 3 drink(s) per week   Drug use: No   Sexual activity: Not on file  Other Topics Concern   Not on file  Social History Narrative   Not on file   Social Determinants of Health   Financial Resource Strain: Not on file  Food Insecurity: Not on file  Transportation Needs: Not on file  Physical Activity: Not on file  Stress: Not on file  Social Connections: Not on file  Intimate Partner Violence: Not on file    Outpatient Medications Prior to Visit  Medication Sig Dispense Refill   acetaminophen (TYLENOL) 500 MG tablet Take 1,000 mg by mouth every 6 (six) hours as needed for moderate pain.     cyclobenzaprine  (FLEXERIL) 5 MG tablet Take 1 tablet (5 mg total) by mouth 2 (two) times daily as needed for muscle spasms. 30 tablet 1   esomeprazole (NEXIUM) 40 MG capsule TAKE 1 CAPSULE(40 MG) BY MOUTH DAILY 90 capsule 1   propranolol (INDERAL) 20 MG tablet TAKE 1 TABLET(20 MG) BY MOUTH TWICE DAILY 180 tablet 0   amLODipine-olmesartan (AZOR) 10-40 MG tablet Take 1 tablet by mouth daily. 30 tablet 3   No facility-administered medications prior to visit.    Allergies  Allergen Reactions   Penicillins Rash    ROS Review of Systems  Constitutional:  Negative for chills and fever.  HENT:  Positive for tinnitus. Negative for congestion and sore throat.        Right ear fullness  Eyes:  Negative for pain and discharge.  Respiratory:  Negative for cough and shortness of breath.   Cardiovascular:  Negative for chest pain and palpitations.  Gastrointestinal:  Negative for diarrhea, nausea and vomiting.  Endocrine: Negative for polydipsia and polyuria.  Genitourinary:  Negative for dysuria and hematuria.  Musculoskeletal:  Negative for neck pain and neck stiffness.       Right foot pain  Skin:  Negative for rash.  Neurological:  Positive for tremors. Negative for dizziness, weakness and numbness.  Psychiatric/Behavioral:  Negative for agitation and behavioral problems.       Objective:    Physical Exam Vitals reviewed.  Constitutional:      General: He is not in acute distress.    Appearance: He is not diaphoretic.  HENT:     Head: Normocephalic and atraumatic.     Right Ear: There is impacted cerumen.     Nose: Nose normal.     Mouth/Throat:     Mouth: Mucous membranes are moist.  Eyes:     General: No scleral icterus.    Extraocular Movements: Extraocular movements intact.  Cardiovascular:     Rate and Rhythm: Normal rate and regular rhythm.     Pulses: Normal pulses.     Heart sounds: Normal heart sounds. No murmur heard. Pulmonary:     Breath sounds: Normal breath sounds. No wheezing  or rales.  Abdominal:     Palpations: Abdomen is soft.     Tenderness: There is no abdominal tenderness.  Musculoskeletal:     Cervical back: Neck supple. No tenderness.     Right lower leg: No edema.     Left lower leg: No edema.  Feet:     Right foot:     Skin integrity: No ulcer, erythema or warmth.  Skin:    General: Skin is warm.     Findings: No erythema.  Neurological:     General: No focal deficit present.  Mental Status: He is alert and oriented to person, place, and time.     Cranial Nerves: No cranial nerve deficit.     Sensory: No sensory deficit.     Motor: Tremor (B/l hands) present. No weakness.  Psychiatric:        Mood and Affect: Mood normal.        Behavior: Behavior normal.     BP 129/79 (BP Location: Right Arm, Patient Position: Sitting, Cuff Size: Normal)   Pulse 78   Ht 5\' 7"  (1.702 m)   Wt 190 lb 6.4 oz (86.4 kg)   SpO2 97%   BMI 29.82 kg/m  Wt Readings from Last 3 Encounters:  04/01/23 190 lb 6.4 oz (86.4 kg)  12/19/22 189 lb 6.4 oz (85.9 kg)  09/29/22 191 lb 14.4 oz (87 kg)    Lab Results  Component Value Date   TSH 1.370 03/30/2023   Lab Results  Component Value Date   WBC 5.6 03/30/2023   HGB 15.0 03/30/2023   HCT 45.7 03/30/2023   MCV 98 (H) 03/30/2023   PLT 224 03/30/2023   Lab Results  Component Value Date   NA 139 03/30/2023   K 4.9 03/30/2023   CO2 23 03/30/2023   GLUCOSE 93 03/30/2023   BUN 11 03/30/2023   CREATININE 1.25 03/30/2023   BILITOT 0.6 03/30/2023   ALKPHOS 73 03/30/2023   AST 17 03/30/2023   ALT 14 03/30/2023   PROT 6.9 03/30/2023   ALBUMIN 4.3 03/30/2023   CALCIUM 9.9 03/30/2023   EGFR 64 03/30/2023   Lab Results  Component Value Date   CHOL 196 03/30/2023   Lab Results  Component Value Date   HDL 63 03/30/2023   Lab Results  Component Value Date   LDLCALC 120 (H) 03/30/2023   Lab Results  Component Value Date   TRIG 72 03/30/2023   Lab Results  Component Value Date   CHOLHDL 3.1  03/30/2023   Lab Results  Component Value Date   HGBA1C 5.5 03/30/2023      Assessment & Plan:   Problem List Items Addressed This Visit       Cardiovascular and Mediastinum   Essential hypertension    BP Readings from Last 1 Encounters:  04/01/23 129/79   Well-controlled with  amlodipine-olmesartan to 10-40 mg QD Continue propranolol for tremors Counseled for compliance with the medications Advised DASH diet and moderate exercise/walking, at least 150 mins/week       Relevant Medications   amLODipine-olmesartan (AZOR) 10-40 MG tablet     Digestive   Barrett's esophagus    S/p Nissen procedure in 2007 On Nexium Followed by GI        Nervous and Auditory   Essential tremor    Has cut down alcohol use Sleep hygiene is discussed Had started propranolol 20 mg BID to help with anxiety and slightly elevated BP, but has been taking it QD      Impacted cerumen of right ear    Right ear irrigation today, he tolerated procedure well Advised to use Debrox as needed for excess earwax        Other   Encounter for general adult medical examination with abnormal findings - Primary    Physical exam as documented. Immunization and cancer screening needs are specifically addressed at this visit.      Mixed hyperlipidemia    Checked lipid profile Advised to follow DASH diet      Relevant Medications   amLODipine-olmesartan (AZOR) 10-40  MG tablet   Right foot pain    Unclear etiology, not a typical site for gout and does not have swelling on the day of exam If persistent, can try NSAID and Podiatry referral       Meds ordered this encounter  Medications   amLODipine-olmesartan (AZOR) 10-40 MG tablet    Sig: Take 1 tablet by mouth daily.    Dispense:  90 tablet    Refill:  1    Follow-up: Return in about 6 months (around 09/30/2023) for HTN.    Anabel Halon, MD

## 2023-04-01 NOTE — Assessment & Plan Note (Signed)
Physical exam as documented. Immunization and cancer screening needs are specifically addressed at this visit.

## 2023-04-01 NOTE — Patient Instructions (Signed)
Please continue to take medications as prescribed.  Please continue to follow low salt diet and perform moderate exercise/walking at least 150 mins/week. 

## 2023-04-01 NOTE — Assessment & Plan Note (Addendum)
Checked lipid profile Advised to follow DASH diet 

## 2023-04-01 NOTE — Assessment & Plan Note (Signed)
Has cut down alcohol use Sleep hygiene is discussed Had started propranolol 20 mg BID to help with anxiety and slightly elevated BP, but has been taking it QD 

## 2023-04-03 ENCOUNTER — Other Ambulatory Visit: Payer: Self-pay | Admitting: Internal Medicine

## 2023-04-03 ENCOUNTER — Telehealth: Payer: Self-pay | Admitting: Internal Medicine

## 2023-04-03 DIAGNOSIS — M79671 Pain in right foot: Secondary | ICD-10-CM | POA: Insufficient documentation

## 2023-04-03 MED ORDER — MELOXICAM 7.5 MG PO TABS: 7.5000 mg | ORAL_TABLET | Freq: Every day | ORAL | 0 refills | Status: DC

## 2023-04-03 NOTE — Assessment & Plan Note (Signed)
Unclear etiology, not a typical site for gout and does not have swelling on the day of exam If persistent, can try NSAID and Podiatry referral

## 2023-04-03 NOTE — Telephone Encounter (Signed)
Patient came by the office said he address his foot last appointment with his physical exam, foot is no better. Have  a nurse give him a call.

## 2023-04-06 ENCOUNTER — Encounter: Payer: Self-pay | Admitting: Internal Medicine

## 2023-04-07 ENCOUNTER — Other Ambulatory Visit: Payer: Self-pay | Admitting: Internal Medicine

## 2023-04-07 DIAGNOSIS — M79671 Pain in right foot: Secondary | ICD-10-CM

## 2023-04-07 MED ORDER — METHYLPREDNISOLONE 4 MG PO TBPK: ORAL_TABLET | ORAL | 0 refills | Status: DC

## 2023-04-15 ENCOUNTER — Other Ambulatory Visit: Payer: Self-pay | Admitting: Internal Medicine

## 2023-04-15 DIAGNOSIS — M79671 Pain in right foot: Secondary | ICD-10-CM

## 2023-04-16 ENCOUNTER — Encounter: Payer: Self-pay | Admitting: Internal Medicine

## 2023-04-16 ENCOUNTER — Other Ambulatory Visit: Payer: Self-pay | Admitting: Internal Medicine

## 2023-04-16 DIAGNOSIS — M79671 Pain in right foot: Secondary | ICD-10-CM

## 2023-04-16 MED ORDER — PREDNISONE 20 MG PO TABS: ORAL_TABLET | ORAL | 0 refills | Status: AC

## 2023-04-20 ENCOUNTER — Other Ambulatory Visit: Payer: Self-pay | Admitting: Internal Medicine

## 2023-04-20 DIAGNOSIS — I10 Essential (primary) hypertension: Secondary | ICD-10-CM

## 2023-05-10 ENCOUNTER — Other Ambulatory Visit: Payer: Self-pay | Admitting: Internal Medicine

## 2023-05-10 DIAGNOSIS — K22719 Barrett's esophagus with dysplasia, unspecified: Secondary | ICD-10-CM

## 2023-06-25 ENCOUNTER — Other Ambulatory Visit: Payer: Self-pay | Admitting: Internal Medicine

## 2023-06-25 DIAGNOSIS — G250 Essential tremor: Secondary | ICD-10-CM

## 2023-09-24 ENCOUNTER — Other Ambulatory Visit: Payer: Self-pay | Admitting: Internal Medicine

## 2023-09-24 DIAGNOSIS — G250 Essential tremor: Secondary | ICD-10-CM

## 2023-10-01 ENCOUNTER — Ambulatory Visit (INDEPENDENT_AMBULATORY_CARE_PROVIDER_SITE_OTHER): Payer: BC Managed Care – PPO | Admitting: Gastroenterology

## 2023-10-02 ENCOUNTER — Encounter: Payer: Self-pay | Admitting: Internal Medicine

## 2023-10-02 ENCOUNTER — Ambulatory Visit: Payer: BC Managed Care – PPO | Admitting: Internal Medicine

## 2023-10-02 VITALS — BP 150/82 | HR 62 | Ht 67.0 in | Wt 196.8 lb

## 2023-10-02 DIAGNOSIS — Z125 Encounter for screening for malignant neoplasm of prostate: Secondary | ICD-10-CM

## 2023-10-02 DIAGNOSIS — R739 Hyperglycemia, unspecified: Secondary | ICD-10-CM

## 2023-10-02 DIAGNOSIS — E782 Mixed hyperlipidemia: Secondary | ICD-10-CM | POA: Diagnosis not present

## 2023-10-02 DIAGNOSIS — G250 Essential tremor: Secondary | ICD-10-CM | POA: Diagnosis not present

## 2023-10-02 DIAGNOSIS — I10 Essential (primary) hypertension: Secondary | ICD-10-CM | POA: Diagnosis not present

## 2023-10-02 DIAGNOSIS — K2270 Barrett's esophagus without dysplasia: Secondary | ICD-10-CM

## 2023-10-02 DIAGNOSIS — E559 Vitamin D deficiency, unspecified: Secondary | ICD-10-CM

## 2023-10-02 DIAGNOSIS — H9192 Unspecified hearing loss, left ear: Secondary | ICD-10-CM

## 2023-10-02 MED ORDER — HYDROCHLOROTHIAZIDE 12.5 MG PO TABS: 12.5000 mg | ORAL_TABLET | Freq: Every day | ORAL | 1 refills | Status: DC

## 2023-10-02 NOTE — Patient Instructions (Addendum)
 Please start taking hydrochlorothiazide 12.5 mg once daily.  Please continue to take other medications as prescribed.  Please continue to follow low carb diet and perform moderate exercise/walking at least 150 mins/week.  Please get fasting blood tests done before the next visit.

## 2023-10-02 NOTE — Assessment & Plan Note (Signed)
Has cut down alcohol use Sleep hygiene is discussed Had started propranolol 20 mg BID to help with anxiety and slightly elevated BP, but has been taking it QD 

## 2023-10-02 NOTE — Assessment & Plan Note (Signed)
Checked lipid profile Advised to follow DASH diet 

## 2023-10-02 NOTE — Assessment & Plan Note (Signed)
S/p Nissen procedure in 2007 On Nexium Followed by GI 

## 2023-10-02 NOTE — Progress Notes (Signed)
 Established Patient Office Visit  Subjective:  Patient ID: Thomas Jordan, male    DOB: Jan 06, 1961  Age: 63 y.o. MRN: [id number]  CC:  Chief Complaint  Patient presents with   Medical Management of Chronic Issues    6 month f/u, had a hearing test done at work, has concerns about the results.     HPI Thomas Jordan is a 63 y.o. male with past medical history of HTN, Barett's esophagus and sarcoidosis who presents for f/u of his chronic medical conditions.  HTN: BP is elevated now. Takes Azor  10-40 mg QD regularly. Patient denies headache, dizziness, chest pain, dyspnea or palpitations.  Barrett's esophagus: On Nexium .  Followed by GI. Recently had GI.  He has mild hand tremors upon activity, which is improved with Propranolol  20 mg QD.  He denies any resting tremor.  He has difficulty doing his routine work at times due to tremors. He used to take 2 beers per day, but has cut down.  He has 1 to 2 cups of coffee per day.  Denies any smoking.  He reports left ear hearing difficulty, which is chronic.  He also has chronic tinnitus.  He recently had a hearing test at his workplace, which showed severe hearing loss at high frequencies.  Denies any ear discharge.  Denies any fever or chills.  Denies nasal congestion or postnasal drip.  He has chronic tinnitus, denies dizziness.  Past Medical History:  Diagnosis Date   Barrett esophagus    GERD (gastroesophageal reflux disease)    Improved since Nissen 2007   Sarcoidosis of lung (HCC)    Diagnosed on CT but has never had issues with it   Ulcer, stomach peptic    Numerous esophageal ulcers prior to Nissen    Past Surgical History:  Procedure Laterality Date   BIOPSY  09/06/2021   Procedure: BIOPSY;  Surgeon: Urban Garden, MD;  [facility];  Service: Gastroenterology;;   COLONOSCOPY WITH PROPOFOL  N/A 09/06/2021   Procedure: COLONOSCOPY WITH PROPOFOL ;  Surgeon: Urban Garden, MD;  [facility];  Service: Gastroenterology;  Laterality: N/A;  815, pt know to arrive at 6:30   ESOPHAGOGASTRODUODENOSCOPY (EGD) WITH PROPOFOL  N/A 09/06/2021   Procedure: ESOPHAGOGASTRODUODENOSCOPY (EGD) WITH PROPOFOL ;  Surgeon: Urban Garden, MD;  [facility];  Service: Gastroenterology;  Laterality: N/A;   LAPAROSCOPIC NISSEN FUNDOPLICATION     POLYPECTOMY  09/06/2021   Procedure: POLYPECTOMY;  Surgeon: Urban Garden, MD;  [facility];  Service: Gastroenterology;;   VASECTOMY     VASECTOMY REVERSAL      Family History  Problem Relation Age of Onset   Heart disease Mother    Hypertension Mother    Heart disease Father     Social History   Socioeconomic History   Marital status: Married    Spouse name: Not on file   Number of children: Not on file   Years of education: Not on file   Highest education level: Not on file  Occupational History   Not on file  Tobacco Use   Smoking status: Never   Smokeless tobacco: Current    Types: Snuff  Vaping Use   Vaping status: Never Used  Substance and Sexual Activity   Alcohol use: Yes    Alcohol/week: 3.0 standard drinks of alcohol    Types: 3 drink(s) per week   Drug use: No   Sexual activity: Not on file  Other Topics Concern  Not on file  Social History Narrative   Not on file   Social Drivers of Health   Financial Resource Strain: Not on file  Food Insecurity: Not on file  Transportation Needs: Not on file  Physical Activity: Not on file  Stress: Not on file  Social Connections: Not on file  Intimate Partner Violence: Not on file    Outpatient Medications Prior to Visit  Medication Sig Dispense Refill   acetaminophen (TYLENOL) 500 MG tablet Take 1,000 mg by mouth every 6 (six) hours as needed for moderate pain.     amLODipine -olmesartan  (AZOR ) 10-40 MG tablet TAKE 1 TABLET BY MOUTH DAILY 30 tablet 0   cyclobenzaprine  (FLEXERIL ) 5 MG tablet Take 1 tablet (5 mg total) by mouth 2  (two) times daily as needed for muscle spasms. 30 tablet 1   esomeprazole  (NEXIUM ) 40 MG capsule TAKE 1 CAPSULE(40 MG) BY MOUTH DAILY 90 capsule 1   propranolol  (INDERAL ) 20 MG tablet TAKE 1 TABLET(20 MG) BY MOUTH TWICE DAILY 180 tablet 0   No facility-administered medications prior to visit.    Allergies  Allergen Reactions   Penicillins Rash    ROS Review of Systems  Constitutional:  Negative for chills and fever.  HENT:  Positive for hearing loss and tinnitus. Negative for congestion and sore throat.        Right ear fullness  Eyes:  Negative for pain and discharge.  Respiratory:  Negative for cough and shortness of breath.   Cardiovascular:  Negative for chest pain and palpitations.  Gastrointestinal:  Negative for diarrhea, nausea and vomiting.  Endocrine: Negative for polydipsia and polyuria.  Genitourinary:  Negative for dysuria and hematuria.  Musculoskeletal:  Negative for neck pain and neck stiffness.       Right foot pain  Skin:  Negative for rash.  Neurological:  Positive for tremors. Negative for dizziness, weakness and numbness.  Psychiatric/Behavioral:  Negative for agitation and behavioral problems.       Objective:    Physical Exam Vitals reviewed.  Constitutional:      General: He is not in acute distress.    Appearance: He is not diaphoretic.  HENT:     Head: Normocephalic and atraumatic.     Nose: Nose normal.     Mouth/Throat:     Mouth: Mucous membranes are moist.  Eyes:     General: No scleral icterus.    Extraocular Movements: Extraocular movements intact.  Cardiovascular:     Rate and Rhythm: Normal rate and regular rhythm.     Heart sounds: Normal heart sounds. No murmur heard. Pulmonary:     Breath sounds: Normal breath sounds. No wheezing or rales.  Musculoskeletal:     Cervical back: Neck supple. No tenderness.     Right lower leg: No edema.     Left lower leg: No edema.  Feet:     Right foot:     Skin integrity: No ulcer, erythema  or warmth.  Skin:    General: Skin is warm.     Findings: No erythema.  Neurological:     General: No focal deficit present.     Mental Status: He is alert and oriented to person, place, and time.     Sensory: No sensory deficit.     Motor: Tremor (B/l hands) present. No weakness.  Psychiatric:        Mood and Affect: Mood normal.        Behavior: Behavior normal.     BP Aaron Aas)  150/82 (BP Location: Left Arm)   Pulse 62   Ht 5\' 7"  (1.702 m)   Wt 196 lb 12.8 oz (89.3 kg)   SpO2 97%   BMI 30.82 kg/m  Wt Readings from Last 3 Encounters:  10/02/23 196 lb 12.8 oz (89.3 kg)  04/01/23 190 lb 6.4 oz (86.4 kg)  12/19/22 189 lb 6.4 oz (85.9 kg)    Lab Results  Component Value Date   TSH 1.370 03/30/2023   Lab Results  Component Value Date   WBC 5.6 03/30/2023   HGB 15.0 03/30/2023   HCT 45.7 03/30/2023   MCV 98 (H) 03/30/2023   PLT 224 03/30/2023   Lab Results  Component Value Date   NA 139 03/30/2023   K 4.9 03/30/2023   CO2 23 03/30/2023   GLUCOSE 93 03/30/2023   BUN 11 03/30/2023   CREATININE 1.25 03/30/2023   BILITOT 0.6 03/30/2023   ALKPHOS 73 03/30/2023   AST 17 03/30/2023   ALT 14 03/30/2023   PROT 6.9 03/30/2023   ALBUMIN 4.3 03/30/2023   CALCIUM 9.9 03/30/2023   EGFR 64 03/30/2023   Lab Results  Component Value Date   CHOL 196 03/30/2023   Lab Results  Component Value Date   HDL 63 03/30/2023   Lab Results  Component Value Date   LDLCALC 120 (H) 03/30/2023   Lab Results  Component Value Date   TRIG 72 03/30/2023   Lab Results  Component Value Date   CHOLHDL 3.1 03/30/2023   Lab Results  Component Value Date   HGBA1C 5.5 03/30/2023      Assessment & Plan:   Problem List Items Addressed This Visit       Cardiovascular and Mediastinum   Essential hypertension - Primary   BP Readings from Last 1 Encounters:  10/02/23 (!) 150/82   Uncontrolled with  amlodipine -olmesartan  to 10-40 mg once daily Added HCTZ 12.5 mg QD Continue  propranolol  for tremors Counseled for compliance with the medications Advised DASH diet and moderate exercise/walking, at least 150 mins/week       Relevant Medications   hydrochlorothiazide  (HYDRODIURIL ) 12.5 MG tablet   Other Relevant Orders   TSH   CMP14+EGFR   CBC with Differential/Platelet     Digestive   Barrett's esophagus   S/p Nissen procedure in 2007 On Nexium  Followed by GI        Nervous and Auditory   Essential tremor   Has cut down alcohol use Sleep hygiene is discussed Had started propranolol  20 mg BID to help with anxiety and slightly elevated BP, but has been taking it QD      Relevant Orders   TSH   Hearing loss of left ear   Has chronic tinnitus Recent hearing test reviewed Referred to ENT specialist for further evaluation      Relevant Orders   Ambulatory referral to ENT     Other   Prostate cancer screening   Ordered PSA after discussing its limitations for prostate cancer screening, including false positive results leading to additional investigations.      Relevant Orders   PSA   Mixed hyperlipidemia   Checked lipid profile Advised to follow DASH diet      Relevant Medications   hydrochlorothiazide  (HYDRODIURIL ) 12.5 MG tablet   Other Relevant Orders   Lipid panel   Other Visit Diagnoses       Vitamin D  deficiency       Relevant Orders   VITAMIN D  25 Hydroxy (Vit-D Deficiency,  Fractures)     Hyperglycemia       Relevant Orders   Hemoglobin A1c        Meds ordered this encounter  Medications   hydrochlorothiazide (HYDRODIURIL) 12.5 MG tablet    Sig: Take 1 tablet (12.5 mg total) by mouth daily.    Dispense:  90 tablet    Refill:  1    Follow-up: Return in about 6 months (around 04/02/2024) for Annual physical (after 03/31/24).    Meldon Sport, MD

## 2023-10-02 NOTE — Assessment & Plan Note (Addendum)
 BP Readings from Last 1 Encounters:  10/02/23 (!) 150/82   Uncontrolled with  amlodipine -olmesartan  to 10-40 mg once daily Added HCTZ 12.5 mg QD Continue propranolol  for tremors Counseled for compliance with the medications Advised DASH diet and moderate exercise/walking, at least 150 mins/week

## 2023-10-02 NOTE — Assessment & Plan Note (Signed)
 Ordered PSA after discussing its limitations for prostate cancer screening, including false positive results leading to additional investigations.

## 2023-10-02 NOTE — Assessment & Plan Note (Signed)
 Has chronic tinnitus Recent hearing test reviewed Referred to ENT specialist for further evaluation

## 2023-10-08 ENCOUNTER — Encounter (INDEPENDENT_AMBULATORY_CARE_PROVIDER_SITE_OTHER): Payer: Self-pay | Admitting: *Deleted

## 2023-10-08 NOTE — Progress Notes (Signed)
 Referring Provider: Meldon Sport, MD Primary Care Physician:  Meldon Sport, MD Primary GI Physician: Dr. Sammi Crick  Chief Complaint  Patient presents with   Follow-up    Follow up. No problems     HPI:   Thomas Jordan is a 63 y.o. male with history of  GERD status post Nissen fundoplication, Barrett's esophagus, sarcoidosis, presenting today for routine follow-up.   Last seen in the office 09/29/2022.  He was doing well without any significant GI symptoms.  He continued to take Nexium  40 mg daily.  Recommended continuing current medications.   Today:  Reports he is doing well overall.  No GI concerns.  Denies nausea, vomiting, heartburn symptoms, abdominal pain, dysphagia, BRBPR, melena, bowel habit changes, unintentional weight loss.  Wants to know if he can have EGD prior to September of this year as company is closing and he does not know if he will have insurance.   Last EGD: 09/06/2021. Changes consistent with long segment Barrett's esophagus (5 cm), biopsies every 1 cm were negative for dysplasia, normal stomach and duodenum Recommended with repeat endoscopy in 3 years.    Last Colonoscopy: 09/06/2021 2 polyps removed from the descending and ascending colon (tubular adenomas) Repeat colonoscopy in 7 years.  Past Medical History:  Diagnosis Date   Barrett esophagus    GERD (gastroesophageal reflux disease)    Improved since Nissen 2007   Sarcoidosis of lung (HCC)    Diagnosed on CT but has never had issues with it   Ulcer, stomach peptic    Numerous esophageal ulcers prior to Nissen    Past Surgical History:  Procedure Laterality Date   BIOPSY  09/06/2021   Procedure: BIOPSY;  Surgeon: Urban Garden, MD;  [facility];  Service: Gastroenterology;;   COLONOSCOPY WITH PROPOFOL  N/A 09/06/2021   Procedure: COLONOSCOPY WITH PROPOFOL ;  Surgeon: Urban Garden, MD;  [facility];  Service: Gastroenterology;  Laterality:  N/A;  815, pt know to arrive at 6:30   ESOPHAGOGASTRODUODENOSCOPY (EGD) WITH PROPOFOL  N/A 09/06/2021   Procedure: ESOPHAGOGASTRODUODENOSCOPY (EGD) WITH PROPOFOL ;  Surgeon: Urban Garden, MD;  [facility];  Service: Gastroenterology;  Laterality: N/A;   LAPAROSCOPIC NISSEN FUNDOPLICATION     POLYPECTOMY  09/06/2021   Procedure: POLYPECTOMY;  Surgeon: Umberto Ganong, Bearl Limes, MD;  [facility];  Service: Gastroenterology;;   VASECTOMY     VASECTOMY REVERSAL      Current Outpatient Medications  Medication Sig Dispense Refill   acetaminophen (TYLENOL) 500 MG tablet Take 1,000 mg by mouth every 6 (six) hours as needed for moderate pain.     amLODipine -olmesartan  (AZOR ) 10-40 MG tablet TAKE 1 TABLET BY MOUTH DAILY 30 tablet 0   cyclobenzaprine  (FLEXERIL ) 5 MG tablet Take 1 tablet (5 mg total) by mouth 2 (two) times daily as needed for muscle spasms. 30 tablet 1   esomeprazole  (NEXIUM ) 40 MG capsule TAKE 1 CAPSULE(40 MG) BY MOUTH DAILY 90 capsule 1   hydrochlorothiazide  (HYDRODIURIL ) 12.5 MG tablet Take 1 tablet (12.5 mg total) by mouth daily. 90 tablet 1   No current facility-administered medications for this visit.    Allergies as of 10/09/2023 - Review Complete 10/09/2023  Allergen Reaction Noted   Penicillins Rash 04/07/2014    Family History  Problem Relation Age of Onset   Heart disease Mother    Hypertension Mother    Heart disease Father     Social History   Socioeconomic History  Marital status: Married    Spouse name: Not on file   Number of children: Not on file   Years of education: Not on file   Highest education level: Not on file  Occupational History   Not on file  Tobacco Use   Smoking status: Never   Smokeless tobacco: Current    Types: Snuff  Vaping Use   Vaping status: Never Used  Substance and Sexual Activity   Alcohol use: Yes    Alcohol/week: 3.0 standard drinks of alcohol    Types: 3 drink(s) per week   Drug  use: No   Sexual activity: Not on file  Other Topics Concern   Not on file  Social History Narrative   Not on file   Social Drivers of Health   Financial Resource Strain: Not on file  Food Insecurity: Not on file  Transportation Needs: Not on file  Physical Activity: Not on file  Stress: Not on file  Social Connections: Not on file    Review of Systems: Gen: Denies fever, chills, cold or flulike symptoms, presyncope, syncope. CV: Denies chest pain, palpitations. Resp: Denies dyspnea, cough. GI: See HPI Heme: See HPI  Physical Exam: BP 133/78 (BP Location: Right Arm, Patient Position: Sitting, Cuff Size: Normal)   Pulse (!) 58   Temp 98.2 F (36.8 C) (Temporal)   Ht 5\' 6"  (1.676 m)   Wt 199 lb 3.2 oz (90.4 kg)   BMI 32.15 kg/m  General:   Alert and oriented. No distress noted. Pleasant and cooperative.  Head:  Normocephalic and atraumatic. Eyes:  Conjuctiva clear without scleral icterus. Heart:  S1, S2 present without murmurs appreciated. Lungs:  Clear to auscultation bilaterally. No wheezes, rales, or rhonchi. No distress.  Abdomen:  +BS, soft, non-tender and non-distended. No rebound or guarding. No HSM or masses noted. Msk:  Symmetrical without gross deformities. Normal posture. Extremities:  Without edema. Neurologic:  Alert and  oriented x4 Psych:  Normal mood and affect.    Assessment:  63 y.o. male with history of  GERD status post Nissen fundoplication, Barrett's esophagus, sarcoidosis, presenting today for routine follow-up.   GERD:  Asymptomatic.  History of modification, but remains on Nexium  40 mg daily due to Barrett's esophagus.  Barrett's esophagus. EGD up-to-date as of March 2023.  Due for surveillance in March 2026.  Patient asking if he can have surveillance EGD in early September 2025 as his company is closing and he isn't sure if he will have insurance.    Plan:  Continue Nexium  40 mg daily. Will discuss possible early interval EGD with Dr.  Sammi Crick.  Follow-up in 1 year.   Shana Daring, PA-C [facility] Gastroenterology 10/09/2023   I have reviewed the note and agree with the APP's assessment as described in this progress note  EGD for surveillance could be considered to be done earlier with the caveat that insurance may not completely cover the procedure.   Samantha Cress, MD Gastroenterology and Hepatology [facility] Gastroenterology

## 2023-10-09 ENCOUNTER — Encounter: Payer: Self-pay | Admitting: Gastroenterology

## 2023-10-09 ENCOUNTER — Ambulatory Visit: Admitting: Gastroenterology

## 2023-10-09 VITALS — BP 133/78 | HR 58 | Temp 98.2°F | Ht 66.0 in | Wt 199.2 lb

## 2023-10-09 DIAGNOSIS — K219 Gastro-esophageal reflux disease without esophagitis: Secondary | ICD-10-CM

## 2023-10-09 DIAGNOSIS — K2270 Barrett's esophagus without dysplasia: Secondary | ICD-10-CM | POA: Diagnosis not present

## 2023-10-09 NOTE — Patient Instructions (Signed)
 Continue Nexium  40 mg daily.   I will talk with Dr. Sammi Crick to see if we could consider surveillance EGD in early September.   It was good to meet you today!   Shana Daring, PA-C [facility] Gastroenterology

## 2023-10-12 NOTE — Telephone Encounter (Signed)
 Mindy/Tammy, can you send the patient the CPT and diagnostic codes that would be used for surveillance EGD for Barrett's esophagus so he can check with his insurance?

## 2023-10-21 ENCOUNTER — Other Ambulatory Visit: Payer: Self-pay | Admitting: Internal Medicine

## 2023-10-21 DIAGNOSIS — I10 Essential (primary) hypertension: Secondary | ICD-10-CM

## 2023-10-30 ENCOUNTER — Other Ambulatory Visit: Payer: Self-pay | Admitting: Internal Medicine

## 2023-10-30 DIAGNOSIS — K22719 Barrett's esophagus with dysplasia, unspecified: Secondary | ICD-10-CM

## 2023-11-19 ENCOUNTER — Other Ambulatory Visit: Payer: Self-pay | Admitting: Internal Medicine

## 2023-11-19 DIAGNOSIS — I10 Essential (primary) hypertension: Secondary | ICD-10-CM

## 2023-12-23 ENCOUNTER — Other Ambulatory Visit: Payer: Self-pay | Admitting: Internal Medicine

## 2023-12-23 DIAGNOSIS — G250 Essential tremor: Secondary | ICD-10-CM

## 2023-12-25 ENCOUNTER — Other Ambulatory Visit: Payer: Self-pay | Admitting: Internal Medicine

## 2023-12-25 DIAGNOSIS — I10 Essential (primary) hypertension: Secondary | ICD-10-CM

## 2023-12-28 ENCOUNTER — Ambulatory Visit (INDEPENDENT_AMBULATORY_CARE_PROVIDER_SITE_OTHER): Admitting: Otolaryngology

## 2023-12-28 ENCOUNTER — Encounter (INDEPENDENT_AMBULATORY_CARE_PROVIDER_SITE_OTHER): Payer: Self-pay | Admitting: Otolaryngology

## 2023-12-28 ENCOUNTER — Ambulatory Visit (INDEPENDENT_AMBULATORY_CARE_PROVIDER_SITE_OTHER): Admitting: Audiology

## 2023-12-28 VITALS — BP 138/80 | HR 59 | Ht 66.0 in | Wt 190.0 lb

## 2023-12-28 DIAGNOSIS — H903 Sensorineural hearing loss, bilateral: Secondary | ICD-10-CM | POA: Diagnosis not present

## 2023-12-28 DIAGNOSIS — F17200 Nicotine dependence, unspecified, uncomplicated: Secondary | ICD-10-CM | POA: Diagnosis not present

## 2023-12-28 DIAGNOSIS — H9312 Tinnitus, left ear: Secondary | ICD-10-CM

## 2023-12-28 NOTE — Progress Notes (Signed)
  [address] [place], [place] [address] [phone]  Audiological Evaluation    Name: Thomas Jordan     DOB:   1960-09-17      MRN:   [id number]                                                                                     Service Date: 12/28/2023     Accompanied by: unaccompanied   Patient comes today after Dr. Tobie, ENT sent a referral for a hearing evaluation due to concerns with hearing loss.   Symptoms Yes Details  Hearing loss  [x]  Both ears- worse in the left ear  Tinnitus  [x]  Both ears- longstanding  Ear pain/ infections/pressure  []    Balance problems  []    Noise exposure history  [x]  Wears protection at work- Office manager  Previous ear surgeries  []    Family history of hearing loss  []    Amplification  []    Other  []      Otoscopy: Right ear: Clear external ear canal and notable landmarks visualized on the tympanic membrane. Left ear:  Clear external ear canal and notable landmarks visualized on the tympanic membrane.  Tympanometry: Right ear: Type A- Normal external ear canal volume with normal middle ear pressure and tympanic membrane compliance. Left ear: Type A- Normal external ear canal volume with normal middle ear pressure and tympanic membrane compliance.    Pure tone Audiometry: Right ear- Normal hearing from [id number] Hz, then moderately severe to severe sensorineural hearing loss from 4000 Hz - 8000 Hz. Left ear-  Normal hearing from [phone] Hz, then mild to severe sensorineural hearing loss from 2000 Hz - 8000 Hz. *hearing asymmetry noted- worse in the left ear.*  Speech Audiometry: Right ear- Speech Reception Threshold (SRT) was obtained at 10 dBHL. Left ear-Speech Reception Threshold (SRT) was obtained at 25 dBHL.   Word Recognition Score Tested using NU-6 (recorded) Right ear: 92% was obtained at a presentation level of 80 dBHL with contralateral masking which is deemed as  excellent. Left ear: 72% was obtained  at a presentation level of 85 dBHL with contralateral masking which is deemed as  fair.  Asymmetry noted - worse in th left ear.   The hearing test results were completed under headphones and re-checked with inserts and results are deemed to be of good reliability. Test technique:  conventional     Recommendations: Follow up with ENT as scheduled for today. Return for a hearing evaluation in 1- 2 years, before if concerns with hearing changes arise or per MD recommendation. Continue using a sound generator to help reduce tinnitus perception, as needed. Use hearing protection when exposed to loud damaging sounds.   Rawlins Stuard MARIE LEROUX-MARTINEZ, AUD
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
## 2024-01-04 ENCOUNTER — Encounter: Payer: Self-pay | Admitting: Audiology

## 2024-03-08 DIAGNOSIS — G250 Essential tremor: Secondary | ICD-10-CM | POA: Diagnosis not present

## 2024-03-08 DIAGNOSIS — E559 Vitamin D deficiency, unspecified: Secondary | ICD-10-CM | POA: Diagnosis not present

## 2024-03-08 DIAGNOSIS — Z125 Encounter for screening for malignant neoplasm of prostate: Secondary | ICD-10-CM | POA: Diagnosis not present

## 2024-03-08 DIAGNOSIS — R739 Hyperglycemia, unspecified: Secondary | ICD-10-CM | POA: Diagnosis not present

## 2024-03-08 DIAGNOSIS — E782 Mixed hyperlipidemia: Secondary | ICD-10-CM | POA: Diagnosis not present

## 2024-03-09 ENCOUNTER — Encounter: Payer: Self-pay | Admitting: Internal Medicine

## 2024-03-09 ENCOUNTER — Ambulatory Visit (INDEPENDENT_AMBULATORY_CARE_PROVIDER_SITE_OTHER): Admitting: Internal Medicine

## 2024-03-09 VITALS — BP 126/80 | HR 58 | Ht 66.0 in | Wt 194.0 lb

## 2024-03-09 DIAGNOSIS — G250 Essential tremor: Secondary | ICD-10-CM

## 2024-03-09 DIAGNOSIS — Z23 Encounter for immunization: Secondary | ICD-10-CM | POA: Diagnosis not present

## 2024-03-09 DIAGNOSIS — E782 Mixed hyperlipidemia: Secondary | ICD-10-CM

## 2024-03-09 DIAGNOSIS — I10 Essential (primary) hypertension: Secondary | ICD-10-CM

## 2024-03-09 LAB — CBC WITH DIFFERENTIAL/PLATELET
Basophils Absolute: 0.1 x10E3/uL (ref 0.0–0.2)
Basos: 1 %
EOS (ABSOLUTE): 0.6 x10E3/uL — ABNORMAL HIGH (ref 0.0–0.4)
Eos: 8 %
Hematocrit: 45.9 % (ref 37.5–51.0)
Hemoglobin: 15.3 g/dL (ref 13.0–17.7)
Immature Grans (Abs): 0.1 x10E3/uL (ref 0.0–0.1)
Immature Granulocytes: 1 %
Lymphocytes Absolute: 1.1 x10E3/uL (ref 0.7–3.1)
Lymphs: 15 %
MCH: 32.8 pg (ref 26.6–33.0)
MCHC: 33.3 g/dL (ref 31.5–35.7)
MCV: 98 fL — ABNORMAL HIGH (ref 79–97)
Monocytes Absolute: 0.6 x10E3/uL (ref 0.1–0.9)
Monocytes: 9 %
Neutrophils Absolute: 4.7 x10E3/uL (ref 1.4–7.0)
Neutrophils: 66 %
Platelets: 233 x10E3/uL (ref 150–450)
RBC: 4.67 x10E6/uL (ref 4.14–5.80)
RDW: 12.5 % (ref 11.6–15.4)
WBC: 7 x10E3/uL (ref 3.4–10.8)

## 2024-03-09 LAB — CMP14+EGFR
ALT: 16 IU/L (ref 0–44)
AST: 18 IU/L (ref 0–40)
Albumin: 4.6 g/dL (ref 3.9–4.9)
Alkaline Phosphatase: 73 IU/L (ref 47–123)
BUN/Creatinine Ratio: 9 — ABNORMAL LOW (ref 10–24)
BUN: 11 mg/dL (ref 8–27)
Bilirubin Total: 0.4 mg/dL (ref 0.0–1.2)
CO2: 22 mmol/L (ref 20–29)
Calcium: 9.6 mg/dL (ref 8.6–10.2)
Chloride: 100 mmol/L (ref 96–106)
Creatinine, Ser: 1.27 mg/dL (ref 0.76–1.27)
Globulin, Total: 2.2 g/dL (ref 1.5–4.5)
Glucose: 89 mg/dL (ref 70–99)
Potassium: 4.9 mmol/L (ref 3.5–5.2)
Sodium: 140 mmol/L (ref 134–144)
Total Protein: 6.8 g/dL (ref 6.0–8.5)
eGFR: 63 mL/min/1.73 (ref >59)

## 2024-03-09 LAB — LIPID PANEL
Chol/HDL Ratio: 3.4 ratio (ref 0.0–5.0)
Cholesterol, Total: 217 mg/dL — ABNORMAL HIGH (ref 100–199)
HDL: 63 mg/dL (ref >39)
LDL Chol Calc (NIH): 143 mg/dL — ABNORMAL HIGH (ref 0–99)
Triglycerides: 60 mg/dL (ref 0–149)
VLDL Cholesterol Cal: 11 mg/dL (ref 5–40)

## 2024-03-09 LAB — PSA: Prostate Specific Ag, Serum: 0.4 ng/mL (ref 0.0–4.0)

## 2024-03-09 LAB — TSH: TSH: 2.12 u[IU]/mL (ref 0.450–4.500)

## 2024-03-09 LAB — VITAMIN D 25 HYDROXY (VIT D DEFICIENCY, FRACTURES): Vit D, 25-Hydroxy: 47.7 ng/mL (ref 30.0–100.0)

## 2024-03-09 LAB — HEMOGLOBIN A1C
Est. average glucose Bld gHb Est-mCnc: 108 mg/dL
Hgb A1c MFr Bld: 5.4 % (ref 4.8–5.6)

## 2024-03-09 MED ORDER — HYDROCHLOROTHIAZIDE 12.5 MG PO TABS: 12.5000 mg | ORAL_TABLET | Freq: Every day | ORAL | 3 refills | Status: DC

## 2024-03-09 NOTE — Assessment & Plan Note (Signed)
 Checked lipid profile - LDL elevated Advised to follow DASH diet If persistently elevated LDL, plan to add statin

## 2024-03-09 NOTE — Progress Notes (Signed)
 Established Patient Office Visit  Subjective:  Patient ID: Thomas Jordan, male    DOB: Feb 15, 1961  Age: 63 y.o. MRN: [id number]  CC:  Chief Complaint  Patient presents with   Hypertension    HPI Thomas Jordan is a 63 y.o. male with past medical history of HTN, Barett's esophagus and sarcoidosis who presents for f/u of his chronic medical conditions.  HTN: BP is WNL now. Takes Azor  10-40 mg QD and HCTZ 12.5 mg QD regularly. Patient denies headache, dizziness, chest pain, dyspnea or palpitations.  Barrett's esophagus: On Nexium .  Followed by GI. Recently had GI.  He has mild hand tremors upon activity, which is improved with Propranolol  20 mg QD.  He denies any resting tremor.  He has difficulty doing his routine work at times due to tremors. He used to take 2 beers per day, but has cut down.  He has 1 to 2 cups of coffee per day.  Denies any smoking.    Past Medical History:  Diagnosis Date   Barrett esophagus    GERD (gastroesophageal reflux disease)    Improved since Nissen 2007   Sarcoidosis of lung    Diagnosed on CT but has never had issues with it   Ulcer, stomach peptic    Numerous esophageal ulcers prior to Nissen    Past Surgical History:  Procedure Laterality Date   BIOPSY  09/06/2021   Procedure: BIOPSY;  Surgeon: Eartha Angelia Sieving, MD;  [facility]: [facility];  Service: Gastroenterology;;   COLONOSCOPY WITH PROPOFOL  N/A 09/06/2021   Procedure: COLONOSCOPY WITH PROPOFOL ;  Surgeon: Eartha Angelia Sieving, MD;  [facility];  Service: Gastroenterology;  Laterality: N/A;  815, pt know to arrive at 6:30   ESOPHAGOGASTRODUODENOSCOPY (EGD) WITH PROPOFOL  N/A 09/06/2021   Procedure: ESOPHAGOGASTRODUODENOSCOPY (EGD) WITH PROPOFOL ;  Surgeon: Eartha Angelia Sieving, MD;  [facility];  Service: Gastroenterology;  Laterality: N/A;   LAPAROSCOPIC NISSEN FUNDOPLICATION     POLYPECTOMY  09/06/2021   Procedure: POLYPECTOMY;  Surgeon: Eartha Angelia Sieving, MD;  [facility];  Service: Gastroenterology;;   VASECTOMY     VASECTOMY REVERSAL      Family History  Problem Relation Age of Onset   Heart disease Mother    Hypertension Mother    Heart disease Father     Social History   Socioeconomic History   Marital status: Married    Spouse name: Not on file   Number of children: Not on file   Years of education: Not on file   Highest education level: Not on file  Occupational History   Not on file  Tobacco Use   Smoking status: Never   Smokeless tobacco: Current    Types: Snuff  Vaping Use   Vaping status: Never Used  Substance and Sexual Activity   Alcohol use: Yes    Alcohol/week: 3.0 standard drinks of alcohol    Types: 3 drink(s) per week   Drug use: No   Sexual activity: Not on file  Other Topics Concern   Not on file  Social History Narrative   Not on file   Social Drivers of Health   Financial Resource Strain: Not on file  Food Insecurity: Not on file  Transportation Needs: Not on file  Physical Activity: Not on file  Stress: Not on file  Social Connections: Not on file  Intimate Partner Violence: Not on file    Outpatient Medications Prior to Visit  Medication Sig Dispense Refill  acetaminophen (TYLENOL) 500 MG tablet Take 1,000 mg by mouth every 6 (six) hours as needed for moderate pain.     amLODipine -olmesartan  (AZOR ) 10-40 MG tablet TAKE 1 TABLET BY MOUTH DAILY 90 tablet 3   cyclobenzaprine  (FLEXERIL ) 5 MG tablet Take 1 tablet (5 mg total) by mouth 2 (two) times daily as needed for muscle spasms. 30 tablet 1   esomeprazole  (NEXIUM ) 40 MG capsule TAKE 1 CAPSULE(40 MG) BY MOUTH DAILY 90 capsule 1   propranolol  (INDERAL ) 20 MG tablet Take 20 mg by mouth 2 (two) times daily.     hydrochlorothiazide  (HYDRODIURIL ) 12.5 MG tablet Take 1 tablet (12.5 mg total) by mouth daily. 90 tablet 1   No facility-administered medications prior to visit.    Allergies  Allergen Reactions    Penicillins Rash    ROS Review of Systems  Constitutional:  Negative for chills and fever.  HENT:  Positive for tinnitus. Negative for congestion and sore throat.   Eyes:  Negative for pain and discharge.  Respiratory:  Negative for cough and shortness of breath.   Cardiovascular:  Negative for chest pain and palpitations.  Gastrointestinal:  Negative for diarrhea, nausea and vomiting.  Endocrine: Negative for polydipsia and polyuria.  Genitourinary:  Negative for dysuria and hematuria.  Musculoskeletal:  Negative for neck pain and neck stiffness.  Skin:  Negative for rash.  Neurological:  Positive for tremors. Negative for dizziness, weakness and numbness.  Psychiatric/Behavioral:  Negative for agitation and behavioral problems.       Objective:    Physical Exam Vitals reviewed.  Constitutional:      General: He is not in acute distress.    Appearance: He is not diaphoretic.  HENT:     Head: Normocephalic and atraumatic.     Nose: Nose normal.     Mouth/Throat:     Mouth: Mucous membranes are moist.  Eyes:     General: No scleral icterus.    Extraocular Movements: Extraocular movements intact.  Cardiovascular:     Rate and Rhythm: Normal rate and regular rhythm.     Heart sounds: Normal heart sounds. No murmur heard. Pulmonary:     Breath sounds: Normal breath sounds. No wheezing or rales.  Musculoskeletal:     Cervical back: Neck supple. No tenderness.     Right lower leg: No edema.     Left lower leg: No edema.  Feet:     Right foot:     Skin integrity: No ulcer, erythema or warmth.  Skin:    General: Skin is warm.     Findings: No erythema.  Neurological:     General: No focal deficit present.     Mental Status: He is alert and oriented to person, place, and time.     Sensory: No sensory deficit.     Motor: Tremor (B/l hands) present. No weakness.  Psychiatric:        Mood and Affect: Mood normal.        Behavior: Behavior normal.     BP 126/80 (BP  Location: Left Arm)   Pulse (!) 58   Ht 5' 6 (1.676 m)   Wt 194 lb (88 kg)   SpO2 98%   BMI 31.31 kg/m  Wt Readings from Last 3 Encounters:  03/09/24 194 lb (88 kg)  12/28/23 190 lb (86.2 kg)  10/09/23 199 lb 3.2 oz (90.4 kg)    Lab Results  Component Value Date   TSH 2.120 03/08/2024   Lab Results  Component Value Date   WBC 7.0 03/08/2024   HGB 15.3 03/08/2024   HCT 45.9 03/08/2024   MCV 98 (H) 03/08/2024   PLT 233 03/08/2024   Lab Results  Component Value Date   NA 140 03/08/2024   K 4.9 03/08/2024   CO2 22 03/08/2024   GLUCOSE 89 03/08/2024   BUN 11 03/08/2024   CREATININE 1.27 03/08/2024   BILITOT 0.4 03/08/2024   ALKPHOS 73 03/08/2024   AST 18 03/08/2024   ALT 16 03/08/2024   PROT 6.8 03/08/2024   ALBUMIN 4.6 03/08/2024   CALCIUM 9.6 03/08/2024   EGFR 63 03/08/2024   Lab Results  Component Value Date   CHOL 217 (H) 03/08/2024   Lab Results  Component Value Date   HDL 63 03/08/2024   Lab Results  Component Value Date   LDLCALC 143 (H) 03/08/2024   Lab Results  Component Value Date   TRIG 60 03/08/2024   Lab Results  Component Value Date   CHOLHDL 3.4 03/08/2024   Lab Results  Component Value Date   HGBA1C 5.4 03/08/2024      Assessment & Plan:   Problem List Items Addressed This Visit       Cardiovascular and Mediastinum   Essential hypertension - Primary   BP Readings from Last 1 Encounters:  03/09/24 126/80   Well-controlled with  amlodipine -olmesartan  to 10-40 mg QD and HCTZ 12.5 mg QD Continue propranolol  for tremors Counseled for compliance with the medications Advised DASH diet and moderate exercise/walking, at least 150 mins/week      Relevant Medications   hydrochlorothiazide  (HYDRODIURIL ) 12.5 MG tablet   Other Relevant Orders   CMP14+EGFR     Nervous and Auditory   Essential tremor   Has cut down alcohol use Sleep hygiene is discussed Had started propranolol  20 mg BID to help with anxiety and slightly  elevated BP, but has been taking it QD        Other   Mixed hyperlipidemia   Checked lipid profile - LDL elevated Advised to follow DASH diet If persistently elevated LDL, plan to add statin      Relevant Medications   hydrochlorothiazide  (HYDRODIURIL ) 12.5 MG tablet   Other Relevant Orders   Lipid Profile   Other Visit Diagnoses       Encounter for immunization       Relevant Orders   Flu vaccine HIGH DOSE PF(Fluzone Trivalent) (Completed)     Encounter for immunization       Relevant Orders   Pneumococcal conjugate vaccine 20-valent (Completed)        Meds ordered this encounter  Medications   hydrochlorothiazide  (HYDRODIURIL ) 12.5 MG tablet    Sig: Take 1 tablet (12.5 mg total) by mouth daily.    Dispense:  90 tablet    Refill:  3    Follow-up: Return in about 6 months (around 09/07/2024) for HTN and HLD.    Suzzane MARLA Blanch, MD

## 2024-03-09 NOTE — Assessment & Plan Note (Addendum)
Has cut down alcohol use Sleep hygiene is discussed Had started propranolol 20 mg BID to help with anxiety and slightly elevated BP, but has been taking it QD 

## 2024-03-09 NOTE — Patient Instructions (Signed)
Please continue to take medications as prescribed.  Please continue to follow low salt diet and perform moderate exercise/walking at least 150 mins/week. 

## 2024-03-09 NOTE — Assessment & Plan Note (Signed)
 BP Readings from Last 1 Encounters:  03/09/24 126/80   Well-controlled with  amlodipine -olmesartan  to 10-40 mg QD and HCTZ 12.5 mg QD Continue propranolol  for tremors Counseled for compliance with the medications Advised DASH diet and moderate exercise/walking, at least 150 mins/week

## 2024-03-21 ENCOUNTER — Other Ambulatory Visit: Payer: Self-pay | Admitting: Internal Medicine

## 2024-03-23 ENCOUNTER — Encounter (INDEPENDENT_AMBULATORY_CARE_PROVIDER_SITE_OTHER): Payer: Self-pay | Admitting: Gastroenterology

## 2024-03-26 ENCOUNTER — Other Ambulatory Visit: Payer: Self-pay | Admitting: Internal Medicine

## 2024-03-26 DIAGNOSIS — I10 Essential (primary) hypertension: Secondary | ICD-10-CM

## 2024-04-08 ENCOUNTER — Encounter: Admitting: Internal Medicine

## 2024-09-07 ENCOUNTER — Encounter: Admitting: Internal Medicine
